# Patient Record
Sex: Male | Born: 2000 | Race: White | Hispanic: Yes | Marital: Single | Smoking: Never smoker
Health system: Southern US, Community
[De-identification: ages and names within clinical notes are randomized; demographics above are authoritative.]

## PROBLEM LIST (undated history)

## (undated) ENCOUNTER — Ambulatory Visit (HOSPITAL_COMMUNITY): Admission: EM | Disposition: A | Discharge status: Home / Self Care | Payer: Self-pay

## (undated) DIAGNOSIS — F909 Attention-deficit hyperactivity disorder, unspecified type: Secondary | ICD-10-CM

## (undated) DIAGNOSIS — F1994 Other psychoactive substance use, unspecified with psychoactive substance-induced mood disorder: Secondary | ICD-10-CM

## (undated) DIAGNOSIS — F1210 Cannabis abuse, uncomplicated: Secondary | ICD-10-CM

## (undated) DIAGNOSIS — F329 Major depressive disorder, single episode, unspecified: Secondary | ICD-10-CM

## (undated) DIAGNOSIS — J45909 Unspecified asthma, uncomplicated: Secondary | ICD-10-CM

## (undated) DIAGNOSIS — M359 Systemic involvement of connective tissue, unspecified: Secondary | ICD-10-CM

## (undated) DIAGNOSIS — Z8659 Personal history of other mental and behavioral disorders: Secondary | ICD-10-CM

## (undated) DIAGNOSIS — C801 Malignant (primary) neoplasm, unspecified: Secondary | ICD-10-CM

## (undated) HISTORY — PX: WISDOM TOOTH EXTRACTION: SHX21

---
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
## 2018-04-12 ENCOUNTER — Observation Stay
Admission: EM | Admit: 2018-04-12 | Discharge: 2018-04-13 | Disposition: A | Discharge status: Home / Self Care | Payer: Self-pay | Attending: Surgery | Admitting: Surgery

## 2018-04-12 ENCOUNTER — Other Ambulatory Visit: Payer: Self-pay

## 2018-04-12 ENCOUNTER — Encounter: Payer: Self-pay | Admitting: Emergency Medicine

## 2018-04-12 DIAGNOSIS — K611 Rectal abscess: Principal | ICD-10-CM | POA: Insufficient documentation

## 2018-04-12 LAB — CBC WITH DIFFERENTIAL/PLATELET
Abs Immature Granulocytes: 0.04 10*3/uL (ref 0.00–0.07)
BASOS ABS: 0 10*3/uL (ref 0.0–0.1)
Basophils Relative: 0 %
EOS ABS: 0.4 10*3/uL (ref 0.0–0.5)
EOS PCT: 3 %
HEMATOCRIT: 39.4 % (ref 39.0–52.0)
Hemoglobin: 13.8 g/dL (ref 13.0–17.0)
IMMATURE GRANULOCYTES: 0 %
LYMPHS ABS: 2.7 10*3/uL (ref 0.7–4.0)
Lymphocytes Relative: 24 %
MCH: 31.1 pg (ref 26.0–34.0)
MCHC: 35 g/dL (ref 30.0–36.0)
MCV: 88.7 fL (ref 80.0–100.0)
MONOS PCT: 8 %
Monocytes Absolute: 0.9 10*3/uL (ref 0.1–1.0)
NEUTROS PCT: 65 %
NRBC: 0 % (ref 0.0–0.2)
Neutro Abs: 7.3 10*3/uL (ref 1.7–7.7)
PLATELETS: 242 10*3/uL (ref 150–400)
RBC: 4.44 MIL/uL (ref 4.22–5.81)
RDW: 12.2 % (ref 11.5–15.5)
WBC: 11.3 10*3/uL — ABNORMAL HIGH (ref 4.0–10.5)

## 2018-04-12 MED ORDER — ONDANSETRON HCL 4 MG/2ML IJ SOLN
4.0000 mg | INTRAMUSCULAR | Status: AC
  Administered 2018-04-12: 4 mg via INTRAVENOUS
  Filled 2018-04-12: qty 2

## 2018-04-12 MED ORDER — MORPHINE SULFATE (PF) 4 MG/ML IV SOLN
4.0000 mg | Freq: Once | INTRAVENOUS | Status: AC
  Administered 2018-04-12: 4 mg via INTRAVENOUS
  Filled 2018-04-12: qty 1

## 2018-04-12 NOTE — ED Triage Notes (Signed)
Pt with non-draining raised area to the buttocks in between the anus and sacrum. Pt reports he has had it since Friday. Pt denies fever.

## 2018-04-13 ENCOUNTER — Encounter: Payer: Self-pay | Admitting: Surgery

## 2018-04-13 ENCOUNTER — Observation Stay: Payer: Self-pay | Admitting: Certified Registered Nurse Anesthetist

## 2018-04-13 ENCOUNTER — Encounter
Admission: EM | Disposition: A | Discharge status: Home / Self Care | Payer: Self-pay | Source: Home / Self Care | Attending: Emergency Medicine

## 2018-04-13 DIAGNOSIS — K611 Rectal abscess: Secondary | ICD-10-CM | POA: Diagnosis present

## 2018-04-13 HISTORY — PX: INCISION AND DRAINAGE ABSCESS: SHX5864

## 2018-04-13 LAB — URINE DRUG SCREEN, QUALITATIVE (ARMC ONLY)
Amphetamines, Ur Screen: NOT DETECTED
Barbiturates, Ur Screen: NOT DETECTED
Benzodiazepine, Ur Scrn: NOT DETECTED
Cannabinoid 50 Ng, Ur ~~LOC~~: POSITIVE — AB
Cocaine Metabolite,Ur ~~LOC~~: NOT DETECTED
MDMA (Ecstasy)Ur Screen: NOT DETECTED
Methadone Scn, Ur: NOT DETECTED
OPIATE, UR SCREEN: POSITIVE — AB
Phencyclidine (PCP) Ur S: NOT DETECTED
Tricyclic, Ur Screen: NOT DETECTED

## 2018-04-13 LAB — BASIC METABOLIC PANEL
Anion gap: 6 (ref 5–15)
BUN: 15 mg/dL (ref 6–20)
CALCIUM: 8.8 mg/dL — AB (ref 8.9–10.3)
CO2: 27 mmol/L (ref 22–32)
CREATININE: 0.91 mg/dL (ref 0.61–1.24)
Chloride: 107 mmol/L (ref 98–111)
GFR calc non Af Amer: >60 mL/min (ref >60)
Glucose, Bld: 94 mg/dL (ref 70–99)
Potassium: 3.7 mmol/L (ref 3.5–5.1)
Sodium: 140 mmol/L (ref 135–145)

## 2018-04-13 SURGERY — INCISION AND DRAINAGE, ABSCESS
Anesthesia: General

## 2018-04-13 MED ORDER — ROCURONIUM BROMIDE 100 MG/10ML IV SOLN
INTRAVENOUS | Status: DC | PRN
  Administered 2018-04-13: 25 mg via INTRAVENOUS

## 2018-04-13 MED ORDER — LIDOCAINE HCL (PF) 2 % IJ SOLN
INTRAMUSCULAR | Status: AC
  Filled 2018-04-13: qty 10

## 2018-04-13 MED ORDER — PROPOFOL 10 MG/ML IV BOLUS
INTRAVENOUS | Status: DC | PRN
  Administered 2018-04-13: 200 mg via INTRAVENOUS

## 2018-04-13 MED ORDER — PROMETHAZINE HCL 25 MG/ML IJ SOLN: 6.2500 mg | INTRAMUSCULAR | Status: DC | PRN

## 2018-04-13 MED ORDER — ROCURONIUM BROMIDE 50 MG/5ML IV SOLN
INTRAVENOUS | Status: AC
  Filled 2018-04-13: qty 1

## 2018-04-13 MED ORDER — MIDAZOLAM HCL 2 MG/2ML IJ SOLN
INTRAMUSCULAR | Status: AC
  Filled 2018-04-13: qty 2

## 2018-04-13 MED ORDER — BUPIVACAINE-EPINEPHRINE (PF) 0.5% -1:200000 IJ SOLN
INTRAMUSCULAR | Status: AC
  Filled 2018-04-13: qty 30

## 2018-04-13 MED ORDER — LIDOCAINE HCL URETHRAL/MUCOSAL 2 % EX GEL
CUTANEOUS | Status: AC
  Filled 2018-04-13: qty 5

## 2018-04-13 MED ORDER — ACETAMINOPHEN 325 MG PO TABS: 650.0000 mg | ORAL_TABLET | Freq: Three times a day (TID) | ORAL | 0 refills | Status: AC | PRN

## 2018-04-13 MED ORDER — SUGAMMADEX SODIUM 200 MG/2ML IV SOLN
INTRAVENOUS | Status: AC
  Filled 2018-04-13: qty 2

## 2018-04-13 MED ORDER — SUCCINYLCHOLINE CHLORIDE 20 MG/ML IJ SOLN
INTRAMUSCULAR | Status: AC
  Filled 2018-04-13: qty 1

## 2018-04-13 MED ORDER — MIDAZOLAM HCL 2 MG/2ML IJ SOLN
INTRAMUSCULAR | Status: DC | PRN
  Administered 2018-04-13: 2 mg via INTRAVENOUS

## 2018-04-13 MED ORDER — BUPIVACAINE-EPINEPHRINE 0.5% -1:200000 IJ SOLN
INTRAMUSCULAR | Status: DC | PRN
  Administered 2018-04-13: 20 mL

## 2018-04-13 MED ORDER — OXYCODONE HCL 5 MG PO TABS
ORAL_TABLET | ORAL | Status: AC
  Filled 2018-04-13: qty 1

## 2018-04-13 MED ORDER — DEXAMETHASONE SODIUM PHOSPHATE 10 MG/ML IJ SOLN
INTRAMUSCULAR | Status: DC | PRN
  Administered 2018-04-13: 10 mg via INTRAVENOUS

## 2018-04-13 MED ORDER — SUGAMMADEX SODIUM 200 MG/2ML IV SOLN
INTRAVENOUS | Status: DC | PRN
  Administered 2018-04-13: 200 mg via INTRAVENOUS

## 2018-04-13 MED ORDER — OXYCODONE HCL 5 MG/5ML PO SOLN: 5.0000 mg | Freq: Once | ORAL | Status: AC | PRN

## 2018-04-13 MED ORDER — MEPERIDINE HCL 50 MG/ML IJ SOLN: 6.2500 mg | INTRAMUSCULAR | Status: DC | PRN

## 2018-04-13 MED ORDER — LACTATED RINGERS IV SOLN
INTRAVENOUS | Status: DC
  Administered 2018-04-13 (×2): via INTRAVENOUS

## 2018-04-13 MED ORDER — GLYCOPYRROLATE 0.2 MG/ML IJ SOLN
INTRAMUSCULAR | Status: AC
  Filled 2018-04-13: qty 1

## 2018-04-13 MED ORDER — OXYCODONE HCL 5 MG PO TABS
5.0000 mg | ORAL_TABLET | Freq: Once | ORAL | Status: AC | PRN
  Administered 2018-04-13: 5 mg via ORAL

## 2018-04-13 MED ORDER — MORPHINE SULFATE (PF) 2 MG/ML IV SOLN
1.0000 mg | INTRAVENOUS | Status: DC | PRN
  Administered 2018-04-13: 1 mg via INTRAVENOUS
  Filled 2018-04-13: qty 1

## 2018-04-13 MED ORDER — DEXAMETHASONE SODIUM PHOSPHATE 10 MG/ML IJ SOLN
INTRAMUSCULAR | Status: AC
  Filled 2018-04-13: qty 1

## 2018-04-13 MED ORDER — ONDANSETRON HCL 4 MG/2ML IJ SOLN
INTRAMUSCULAR | Status: DC | PRN
  Administered 2018-04-13: 4 mg via INTRAVENOUS

## 2018-04-13 MED ORDER — SUCCINYLCHOLINE CHLORIDE 20 MG/ML IJ SOLN
INTRAMUSCULAR | Status: DC | PRN
  Administered 2018-04-13: 100 mg via INTRAVENOUS

## 2018-04-13 MED ORDER — SODIUM CHLORIDE 0.9 % IV SOLN
3.0000 g | Freq: Four times a day (QID) | INTRAVENOUS | Status: DC
  Administered 2018-04-13: 3 g via INTRAVENOUS
  Filled 2018-04-13: qty 3

## 2018-04-13 MED ORDER — ONDANSETRON HCL 4 MG/2ML IJ SOLN
INTRAMUSCULAR | Status: AC
  Filled 2018-04-13: qty 2

## 2018-04-13 MED ORDER — DOCUSATE SODIUM 100 MG PO CAPS: 100.0000 mg | ORAL_CAPSULE | Freq: Two times a day (BID) | ORAL | 0 refills | Status: AC | PRN

## 2018-04-13 MED ORDER — FENTANYL CITRATE (PF) 100 MCG/2ML IJ SOLN
INTRAMUSCULAR | Status: AC
  Filled 2018-04-13: qty 2

## 2018-04-13 MED ORDER — IBUPROFEN 800 MG PO TABS: 800.0000 mg | ORAL_TABLET | Freq: Three times a day (TID) | ORAL | 0 refills | Status: DC | PRN

## 2018-04-13 MED ORDER — HYDROCODONE-ACETAMINOPHEN 5-325 MG PO TABS: 1.0000 | ORAL_TABLET | Freq: Four times a day (QID) | ORAL | 0 refills | Status: AC | PRN

## 2018-04-13 MED ORDER — PHENYLEPHRINE HCL 10 MG/ML IJ SOLN
INTRAMUSCULAR | Status: AC
  Filled 2018-04-13: qty 1

## 2018-04-13 MED ORDER — ONDANSETRON 4 MG PO TBDP: 4.0000 mg | ORAL_TABLET | Freq: Four times a day (QID) | ORAL | Status: DC | PRN

## 2018-04-13 MED ORDER — PROPOFOL 10 MG/ML IV BOLUS
INTRAVENOUS | Status: AC
  Filled 2018-04-13: qty 40

## 2018-04-13 MED ORDER — LIDOCAINE HCL (CARDIAC) PF 100 MG/5ML IV SOSY
PREFILLED_SYRINGE | INTRAVENOUS | Status: DC | PRN
  Administered 2018-04-13: 100 mg via INTRAVENOUS

## 2018-04-13 MED ORDER — DOCUSATE SODIUM 100 MG PO CAPS: 100.0000 mg | ORAL_CAPSULE | Freq: Two times a day (BID) | ORAL | Status: DC | PRN

## 2018-04-13 MED ORDER — FENTANYL CITRATE (PF) 100 MCG/2ML IJ SOLN: 25.0000 ug | INTRAMUSCULAR | Status: DC | PRN

## 2018-04-13 MED ORDER — FENTANYL CITRATE (PF) 100 MCG/2ML IJ SOLN
INTRAMUSCULAR | Status: DC | PRN
  Administered 2018-04-13 (×2): 50 ug via INTRAVENOUS

## 2018-04-13 MED ORDER — ONDANSETRON HCL 4 MG/2ML IJ SOLN: 4.0000 mg | Freq: Four times a day (QID) | INTRAMUSCULAR | Status: DC | PRN

## 2018-04-13 SURGICAL SUPPLY — 26 items
BLADE SURG 15 STRL LF DISP TIS (BLADE) ×1 IMPLANT
BLADE SURG 15 STRL SS (BLADE) ×1
CANISTER SUCT 1200ML W/VALVE (MISCELLANEOUS) ×2 IMPLANT
CHLORAPREP W/TINT 26ML (MISCELLANEOUS) ×2 IMPLANT
COVER WAND RF STERILE (DRAPES) ×2 IMPLANT
DRAPE LAPAROTOMY 100X77 ABD (DRAPES) ×2 IMPLANT
ELECT REM PT RETURN 9FT ADLT (ELECTROSURGICAL) ×2
ELECTRODE REM PT RTRN 9FT ADLT (ELECTROSURGICAL) ×1 IMPLANT
GAUZE PACKING IODOFORM 1/2 (PACKING) ×2 IMPLANT
GLOVE BIOGEL PI IND STRL 7.0 (GLOVE) ×1 IMPLANT
GLOVE BIOGEL PI INDICATOR 7.0 (GLOVE) ×1
GLOVE SURG SYN 7.0 (GLOVE) ×4 IMPLANT
GOWN STRL REUS W/ TWL LRG LVL3 (GOWN DISPOSABLE) ×2 IMPLANT
GOWN STRL REUS W/TWL LRG LVL3 (GOWN DISPOSABLE) ×2
LABEL OR SOLS (LABEL) ×2 IMPLANT
NEEDLE HYPO 22GX1.5 SAFETY (NEEDLE) ×2 IMPLANT
NS IRRIG 500ML POUR BTL (IV SOLUTION) ×2 IMPLANT
PACK BASIN MINOR ARMC (MISCELLANEOUS) ×2 IMPLANT
SUT MNCRL 4-0 (SUTURE)
SUT MNCRL 4-0 27XMFL (SUTURE)
SUT VIC AB 2-0 CT2 27 (SUTURE) IMPLANT
SUT VIC AB 3-0 SH 27 (SUTURE)
SUT VIC AB 3-0 SH 27X BRD (SUTURE) IMPLANT
SUTURE MNCRL 4-0 27XMF (SUTURE) IMPLANT
SYR 10ML LL (SYRINGE) ×2 IMPLANT
TOWEL OR 17X26 4PK STRL BLUE (TOWEL DISPOSABLE) ×2 IMPLANT

## 2018-04-13 NOTE — ED Notes (Signed)
Pt resting on stretcher with lights out to enhance rest. Eyes closed and even respirations. No acute distress noted. Family at the bedside.

## 2018-04-13 NOTE — Anesthesia Postprocedure Evaluation (Signed)
Anesthesia Post Note  Patient: Emergency planning/management officer  Procedure(s) Performed: INCISION AND DRAINAGE ABSCESS (N/A )  Patient location during evaluation: PACU Anesthesia Type: General Level of consciousness: awake and alert and oriented Pain management: pain level controlled Vital Signs Assessment: post-procedure vital signs reviewed and stable Respiratory status: spontaneous breathing, nonlabored ventilation and respiratory function stable Cardiovascular status: blood pressure returned to baseline and stable Postop Assessment: no signs of nausea or vomiting Anesthetic complications: no     Last Vitals:  Vitals:   04/13/18 1007 04/13/18 1049  BP: 118/73 113/74  Pulse: 63 66  Resp: 14 16  Temp: (!) 36.3 C   SpO2: 100% 99%    Last Pain:  Vitals:   04/13/18 1049  TempSrc:   PainSc: 2                  Cairo Lingenfelter

## 2018-04-13 NOTE — ED Notes (Signed)
Pt being transported to OR at this time via stretcher.

## 2018-04-13 NOTE — Op Note (Signed)
Preoperative diagnosis: perirectal abscess Postoperative diagnosis: same  Procedure: Incision and drainage of perirectal abscess  Anesthesia: GETA  Surgeon: Lysle Pearl  Wound Classification: Contaminated  Indications: Patient is a 18 y.o. male  presented with perirectal abscess.  See H&P for further details.  Specimen: wound cultures  Complications: None  Estimated Blood Loss: 42mL  Findings:  1. Isolated perirectal abscess.  No indication of fistula or other pathology 2. purulent secretions drained and cultured 3. Adequate hemostasis.   Description of procedure: The patient was placed in the prone position after GETA anesthesia was induced on the stretcher. The area was prepped and draped in the usual sterile fashion. A timeout was completed verifying correct patient, procedure, site, positioning, and implant(s) and/or special equipment prior to beginning this procedure.   Inicision made across the most indurated area of the abscess and purulent secretions noted in subcutaneous tissue and drained. Cultures taken.  Incision then extended in a cruciate fashion to ensure entire cavity base was explored.  With a hemostat blunt dissection of septas performed to drain the abscess completely.  Probing of the wound base did not note any fistulas, or additional tunneling.   The 3cm skin incision over the 7cm x 6cm x 5cm deep cavity then irrigated, hemostasis achieved with electrocautery and pressure.  Wound then packed with an iodine packing, dressed with fluffs and secured with mesh undergarment.  The patient tolerated the procedure well and was taken to the postanesthesia care unit in satisfactory condition. All sponge and instrument counts correct at end of procedure.

## 2018-04-13 NOTE — H&P (Signed)
Subjective:   CC: perianal abscess  HPI:  Derrick Mayer is a 18 y.o. male who was consulted by [place] for evaluation of above. First noted 5 days ago.  Symptoms include: Pain is sharp, increasing.  Exacerbated by palpation or movement.  Alleviated by nothing specific.  Associated with increasing lump in the area.     Past Medical History:  has a past medical history of ADHD (attention deficit hyperactivity disorder), Asthma, Attention deficit hyperactivity disorder (ADHD) (05/24/2015), Cancer ([place]), Cannabis abuse (05/20/2015), Collagen vascular disease ([place]), Depressive disorder (05/20/2015), History of ADHD (05/20/2015), and Substance induced mood disorder ([place]) (05/20/2015).  Past Surgical History:  has a past surgical history that includes Wisdom tooth extraction.  Family History: reviwed and not relevant to CC  Social History:  reports that he has never smoked. He has never used smokeless tobacco. He reports that he does not drink alcohol, but admits to marijuana use  Current Medications: none reported  Allergies:  No Known Allergies  ROS:  General: Denies weight loss, weight gain, fatigue, fevers, chills, and night sweats. Eyes: Denies blurry vision, double vision, eye pain, itchy eyes, and tearing. Ears: Denies hearing loss, earache, and ringing in ears. Nose: Denies sinus pain, congestion, infections, runny nose, and nosebleeds. Mouth/throat: Denies hoarseness, sore throat, bleeding gums, and difficulty swallowing. Heart: Denies chest pain, palpitations, racing heart, irregular heartbeat, leg pain or swelling, and decreased activity tolerance. Respiratory: Denies breathing difficulty, shortness of breath, wheezing, cough, and sputum. GI: Denies change in appetite, heartburn, nausea, vomiting, constipation, diarrhea, and blood in stool. GU: Denies difficulty urinating, pain with urinating, urgency, frequency, blood in urine. Musculoskeletal: Denies joint stiffness, pain, swelling,  muscle weakness. Skin: Denies rash, itching, mass, tumors, sores, and boils Neurologic: Denies headache, fainting, dizziness, seizures, numbness, and tingling. Psychiatric: Denies depression, anxiety, difficulty sleeping, and memory loss. Endocrine: Denies heat or cold intolerance, and increased thirst or urination. Blood/lymph: Denies easy bruising, easy bruising, and swollen glands    Objective:     BP 121/68   Pulse 66   Temp 98.1 F (36.7 C) (Oral)   Resp 15   Wt 81.6 kg   SpO2 99%   Constitutional :  alert, cooperative, appears stated age and no distress  Lymphatics/Throat:  no asymmetry, masses, or scars  Respiratory:  clear to auscultation bilaterally  Cardiovascular:  regular rate and rhythm  Gastrointestinal: soft, non-tender; bowel sounds normal; no masses,  no organomegaly.  Musculoskeletal: Steady gait and movement  Skin: Cool and moist, large, erythematous, indurated area consistent with abscess in right perirectal region, measuring approx 8 cm x 6cm   Psychiatric: Normal affect, non-agitated, not confused       LABS:  CMP Latest Ref Rng & Units 04/01/2016 06/22/2015 06/15/2015  Glucose 65 - 99 mg/dL 126(H) 102(H) 106(H)  BUN 6 - 20 mg/dL 13 14 10   Creatinine 0.50 - 1.00 mg/dL 1.20(H) 0.89 1.02(H)  Sodium 135 - 145 mmol/L 136 137 140  Potassium 3.5 - 5.1 mmol/L 3.5 3.5 3.6  Chloride 101 - 111 mmol/L 102 106 107  CO2 22 - 32 mmol/L 24 22 22   Calcium 8.9 - 10.3 mg/dL 9.1 8.5(L) 9.3  Total Protein 6.5 - 8.1 g/dL 7.9 7.3 7.3  Total Bilirubin 0.3 - 1.2 mg/dL 0.6 0.6 0.7  Alkaline Phos 52 - 171 U/L 76 116 110  AST 15 - 41 U/L 25 20 23   ALT 17 - 63 U/L 21 16(L) 18   CBC Latest Ref Rng & Units 04/12/2018 04/01/2016  06/22/2015  WBC 4.0 - 10.5 K/uL 11.3(H) 6.6 8.0  Hemoglobin 13.0 - 17.0 g/dL 13.8 16.1(H) 14.5  Hematocrit 39.0 - 52.0 % 39.4 43.8 41.3  Platelets 150 - 400 K/uL 242 158 186    RADS: n/a  Assessment:      Perirectal abscess  Plan:     1.  Alternatives include continued observation with abx.  Benefits include possible symptom relief, . Discussed the risk of surgery including recurrence, poor/delayed wound healing, and possible re-operation to address said risks. The risks of general anesthetic, if used, includes MI, CVA, sudden death or even reaction to anesthetic medications also discussed.  Typical post-op recovery time of 3-5 days with possible activity restrictions were also discussed.  The patient verbalized understanding and all questions were answered to the patient's satisfaction.  Admit to floor while waiting for OR availability since patient is refusing bedside I&D.  Scheduled for 0700.

## 2018-04-13 NOTE — ED Notes (Signed)
Pt resting on stretcher with lights out to enhance rest. Family at the bedside. No distress noted.

## 2018-04-13 NOTE — Transfer of Care (Signed)
Immediate Anesthesia Transfer of Care Note  Patient: Saeed Brumett  Procedure(s) Performed: INCISION AND DRAINAGE ABSCESS (N/A )  Patient Location: PACU  Anesthesia Type:General  Level of Consciousness: drowsy  Airway & Oxygen Therapy: Patient Spontanous Breathing and Patient connected to face mask oxygen  Post-op Assessment: Report given to RN and Post -op Vital signs reviewed and stable  Post vital signs: Reviewed and stable  Last Vitals:  Vitals Value Taken Time  BP 101/76 04/13/2018 9 ;02  Temp    Pulse 71 04/13/2018  9:19 AM  Resp 15 04/13/2018  9:19 AM  SpO2 100 % 04/13/2018  9:19 AM  Vitals shown include unvalidated device data.  Last Pain:  Vitals:   04/13/18 0902  TempSrc:   PainSc: Asleep         Complications: No apparent anesthesia complications

## 2018-04-13 NOTE — ED Notes (Addendum)
Surgery set for 0700 on 04/13/2018 at this time.

## 2018-04-13 NOTE — Anesthesia Procedure Notes (Signed)
Procedure Name: Intubation Date/Time: 04/13/2018 8:15 AM Performed by: Rudean Hitt, CRNA Pre-anesthesia Checklist: Patient identified, Patient being monitored, Timeout performed, Emergency Drugs available and Suction available Patient Re-evaluated:Patient Re-evaluated prior to induction Oxygen Delivery Method: Circle system utilized Preoxygenation: Pre-oxygenation with 100% oxygen Induction Type: IV induction Ventilation: Mask ventilation without difficulty Laryngoscope Size: Mac and 3 Grade View: Grade I Tube type: Oral Tube size: 7.5 mm Number of attempts: 1 Airway Equipment and Method: Stylet Placement Confirmation: ETT inserted through vocal cords under direct vision,  positive ETCO2 and breath sounds checked- equal and bilateral Secured at: 22 cm Tube secured with: Tape Dental Injury: Teeth and Oropharynx as per pre-operative assessment

## 2018-04-13 NOTE — Anesthesia Preprocedure Evaluation (Signed)
Anesthesia Evaluation  Patient identified by MRN, date of birth, ID band Patient awake    Reviewed: Allergy & Precautions, NPO status , Patient's Chart, lab work & pertinent test results  History of Anesthesia Complications Negative for: history of anesthetic complications  Airway Mallampati: II  TM Distance: >3 FB Neck ROM: Full    Dental no notable dental hx.    Pulmonary neg pulmonary ROS, neg sleep apnea, neg COPD,    breath sounds clear to auscultation- rhonchi (-) wheezing      Cardiovascular Exercise Tolerance: Good (-) hypertension(-) CAD and (-) Past MI  Rhythm:Regular Rate:Normal - Systolic murmurs and - Diastolic murmurs    Neuro/Psych PSYCHIATRIC DISORDERS Depression negative neurological ROS     GI/Hepatic negative GI ROS, Neg liver ROS,   Endo/Other  negative endocrine ROSneg diabetes  Renal/GU negative Renal ROS     Musculoskeletal negative musculoskeletal ROS (+)   Abdominal (+) - obese,   Peds  Hematology negative hematology ROS (+)   Anesthesia Other Findings Past Medical History: No date: ADHD (attention deficit hyperactivity disorder) No date: Asthma 05/24/2015: Attention deficit hyperactivity disorder (ADHD) No date: Cancer ([place]) 05/20/2015: Cannabis abuse No date: Collagen vascular disease ([place]) 05/20/2015: Depressive disorder 05/20/2015: History of ADHD 05/20/2015: Substance induced mood disorder ([facility])   Reproductive/Obstetrics                             Anesthesia Physical Anesthesia Plan  ASA: II  Anesthesia Plan: General   Post-op Pain Management:    Induction: Intravenous  PONV Risk Score and Plan: 1 and Ondansetron, Dexamethasone and Midazolam  Airway Management Planned: Oral ETT  Additional Equipment:   Intra-op Plan:   Post-operative Plan: Extubation in OR  Informed Consent: I have reviewed the patients History and Physical, chart, labs and  discussed the procedure including the risks, benefits and alternatives for the proposed anesthesia with the patient or authorized representative who has indicated his/her understanding and acceptance.     Dental advisory given  Plan Discussed with: CRNA and Anesthesiologist  Anesthesia Plan Comments:         Anesthesia Quick Evaluation

## 2018-04-13 NOTE — ED Notes (Signed)
Pt asked if he had spoken with the surgeon about his surgery. Pt states he was told the time but not about the procedure. Pt wanting to wait until he speaks with surgeon prior to signing his consent form. Will send to OR with pt.

## 2018-04-13 NOTE — ED Provider Notes (Signed)
[facility] Emergency Department Provider Note  ____________________________________________   First MD Initiated Contact with Patient 04/12/18 2300     (approximate)  I have reviewed the triage vital signs and the nursing notes.   HISTORY  Chief Complaint Abscess    HPI Derrick Mayer is a 18 y.o. male with medical history as listed below who presents for evaluation of a red and severely painful area on his left buttock.  It started developing about 4 to 5 days ago and is steadily gotten worse.  It is much worse with any pressure, sitting, and now walking.  Nothing in particular makes it better.  He cannot see exactly where it is but it seems to be located on his left buttock and tracks towards his anus.  He is having normal bowel movements but wiping is very painful.  He denies fever/chills, chest pain, shortness of breath, nausea, vomiting, and abdominal pain.  He has no pain in his penis or scrotum.  He has a history of a prior abscess that was up higher on his buttock but says that this is much much worse.  The pain is severe, sharp, and aching.  Past Medical History:  Diagnosis Date  . ADHD (attention deficit hyperactivity disorder)   . Asthma   . Attention deficit hyperactivity disorder (ADHD) 05/24/2015  . Cancer ([place])   . Cannabis abuse 05/20/2015  . Collagen vascular disease ([place])   . Depressive disorder 05/20/2015  . History of ADHD 05/20/2015  . Substance induced mood disorder ([place]) 05/20/2015    Patient Active Problem List   Diagnosis Date Noted  . Perirectal abscess 04/13/2018  . Attention deficit hyperactivity disorder (ADHD) 05/24/2015  . Cannabis abuse 05/20/2015  . Depressive disorder 05/20/2015  . Substance induced mood disorder ([place]) 05/20/2015  . ADHD (attention deficit hyperactivity disorder) 10/30/2012  . Knee contusion 09/29/2012  . Left knee sprain 09/13/2012  . Abrasion of left leg 06/20/2012    Past Surgical History:    Procedure Laterality Date  . WISDOM TOOTH EXTRACTION      Prior to Admission medications   Medication Sig Start Date End Date Taking? Authorizing Provider  doxycycline (VIBRA-TABS) 100 MG tablet Take 1 tablet (100 mg total) by mouth 2 (two) times daily. 07/06/17   Robyn Haber, MD  ibuprofen (ADVIL,MOTRIN) 800 MG tablet Take 1 tablet (800 mg total) by mouth 3 (three) times daily. 07/08/17   Wieters, Hallie C, PA-C    Allergies Patient has no known allergies.  History reviewed. No pertinent family history.  Social History Social History   Tobacco Use  . Smoking status: Never Smoker  . Smokeless tobacco: Never Used  Substance Use Topics  . Alcohol use: No  . Drug use: No    Types: Marijuana    Review of Systems Constitutional: No fever/chills Eyes: No visual changes. ENT: No sore throat. Cardiovascular: Denies chest pain. Respiratory: Denies shortness of breath. Gastrointestinal: No abdominal pain.  No nausea, no vomiting.  No diarrhea.  No constipation. Genitourinary: Severe pain, redness, and swelling of his left buttock as described above. Musculoskeletal: Negative for neck pain.  Negative for back pain. Integumentary: Negative for rash. Neurological: Negative for headaches, focal weakness or numbness.   ____________________________________________   PHYSICAL EXAM:  VITAL SIGNS: ED Triage Vitals  Enc Vitals Group     BP 04/12/18 2052 115/74     Pulse Rate 04/12/18 2052 66     Resp 04/12/18 2052 17     Temp 04/12/18  2052 98.1 F (36.7 C)     Temp Source 04/12/18 2052 Oral     SpO2 04/12/18 2052 98 %     Weight 04/12/18 2052 81.6 kg (180 lb)     Height --      Head Circumference --      Peak Flow --      Pain Score 04/12/18 2310 10     Pain Loc --      Pain Edu? --      Excl. in [place]? --     Constitutional: Alert and oriented.  Appears very uncomfortable. Eyes: Conjunctivae are normal.  Head: Atraumatic. Nose: No  congestion/rhinnorhea. Mouth/Throat: Mucous membranes are moist. Neck: No stridor.  No meningeal signs.   Cardiovascular: Normal rate, regular rhythm. Good peripheral circulation. Grossly normal heart sounds. Respiratory: Normal respiratory effort.  No retractions. Lungs CTAB. Gastrointestinal: Soft and nontender. No distention.  GU/Rectal: Large area of erythema and induration measuring at least 8 cm x 6 cm on his left buttock tracking directly to the anus.  Exam limited by the patient's pain.  No fluctuance but again the exam is limited.  No drainage.  No crepitus of the perineum, no purulence or discharge, no scrotal involvement. Musculoskeletal: No lower extremity tenderness nor edema. No gross deformities of extremities. Neurologic:  Normal speech and language. No gross focal neurologic deficits are appreciated.  Skin:  Skin is warm, dry and intact. No rash noted. Psychiatric: Mood and affect are normal. Speech and behavior are normal.  ____________________________________________   LABS (all labs ordered are listed, but only abnormal results are displayed)  Labs Reviewed  CBC WITH DIFFERENTIAL/PLATELET - Abnormal; Notable for the following components:      Result Value   WBC 11.3 (*)    All other components within normal limits  BASIC METABOLIC PANEL - Abnormal; Notable for the following components:   Calcium 8.8 (*)    All other components within normal limits   ____________________________________________  EKG  No indication for EKG ____________________________________________  RADIOLOGY   ED MD interpretation: No indication for imaging  Official radiology report(s): No results found.  ____________________________________________   PROCEDURES  Critical Care performed: No   Procedure(s) performed:   Procedures   ____________________________________________   INITIAL IMPRESSION / ASSESSMENT AND PLAN / ED COURSE  As part of my medical decision making, I  reviewed the following data within the [place] notes reviewed and incorporated, Labs reviewed , Discussed with admitting physician , A consult was requested and obtained from this/these consultant(s) Surgery, Notes from prior ED visits and [place] Controlled Substance Database    Differential diagnosis includes, but is not limited to, cutaneous abscess of the buttock, perirectal abscess, perianal abscess, Fournier's gangrene.  The size of the abscess is very large and not amenable to bedside drainage.  The patient is also in severe pain and is begging to be put to sleep before anything is drained.  He is awfully very uncomfortable and is being given morphine 4 mg IV and Zofran 4 mg IV.  I have asked him to not eat or drink anything and the last time he had anything to eat or drink was 6 PM.  I will consult surgery to ask him to look at the patient but I may also need to obtain a CT scan of the pelvis with IV contrast for further evaluation.  I think he would be best served by incision and drainage in the operating room.  The patient  agrees with the plan.  Clinical Course as of Apr 14 27  Tue Apr 12, 2018  2342 I spoke by phone with Dr. Lysle Pearl with general surgery about the location and size of the patient's abscess.  He is coming in to evaluate the patient in person and asked that I not proceed yet with a CT scan.   [CF]  Wed Apr 13, 2018  0029 I spoke with Dr. Lysle Pearl in person after he evaluated the patient.  He will admit the patient to the hospital and take him to the OR in the morning.   [CF]    Clinical Course User Index [CF] Hinda Kehr, MD    ____________________________________________  FINAL CLINICAL IMPRESSION(S) / ED DIAGNOSES  Final diagnoses:  Perirectal abscess     MEDICATIONS GIVEN DURING THIS VISIT:  Medications  morphine 4 MG/ML injection 4 mg (4 mg Intravenous Given 04/12/18 2347)  ondansetron (ZOFRAN) injection 4 mg (4 mg Intravenous Given  04/12/18 2347)     ED Discharge Orders    None       Note:  This document was prepared using Dragon voice recognition software and may include unintentional dictation errors.   Hinda Kehr, MD 04/13/18 [id number]

## 2018-04-13 NOTE — Discharge Instructions (Signed)

## 2018-04-13 NOTE — ED Notes (Signed)
Pt moved to room 13 due to needing negative pressure room. Tolerated well. Pt is alert with family at the bedside. Reminded of proposed plan for surgery. Verbalized understanding.

## 2018-04-13 NOTE — Anesthesia Post-op Follow-up Note (Signed)
Anesthesia QCDR form completed.        

## 2018-04-15 NOTE — Discharge Summary (Signed)
Physician Discharge Summary  Patient ID: Derrick Mayer MRN: [id number] DOB/AGE: [birth year] 18 y.o.  Admit date: 04/12/2018 Discharge date: 04/15/2018  Admission Diagnoses: perirectal abscess  Discharge Diagnoses:  Same as above  Discharged Condition: good  Hospital Course: diagnosed with perirectal abscess, went to OR for I&D, sent home with close f/u  Consults: None  Discharge Exam: Blood pressure 113/74, pulse 66, temperature (!) 97.3 F (36.3 C), temperature source Temporal, resp. rate 16, weight 81.6 kg, SpO2 99 %. General appearance: alert, cooperative and no distress  Disposition:  Discharge disposition: 01-Home or Self Care       Discharge Instructions    Discharge patient   Complete by:  As directed    Discharge disposition:  01-Home or Self Care   Discharge patient date:  04/13/2018     Allergies as of 04/13/2018   No Known Allergies     Medication List    STOP taking these medications   doxycycline 100 MG tablet Commonly known as:  VIBRA-TABS     TAKE these medications   acetaminophen 325 MG tablet Commonly known as:  TYLENOL Take 2 tablets (650 mg total) by mouth every 8 (eight) hours as needed for up to 30 days for mild pain. What changed:    medication strength  how much to take  when to take this  reasons to take this   docusate sodium 100 MG capsule Commonly known as:  COLACE Take 1 capsule (100 mg total) by mouth 2 (two) times daily as needed for up to 10 days for mild constipation.   HYDROcodone-acetaminophen 5-325 MG tablet Commonly known as:  NORCO Take 1 tablet by mouth every 6 (six) hours as needed for up to 3 days for moderate pain.   ibuprofen 800 MG tablet Commonly known as:  ADVIL,MOTRIN Take 1 tablet (800 mg total) by mouth every 8 (eight) hours as needed for mild pain or moderate pain. What changed:    when to take this  reasons to take this      Follow-up Information    Follow up On 04/15/2018.   Why:  For wound  re-check and packing removal       Derrick Mayer, Derrick Osmer, DO Follow up on 04/15/2018.   Specialty:  Surgery Why:  @ 9:15 am for wound re-check and packing removal Contact information: [place] [address] [phone]            Signed: Benjamine Mayer 04/15/2018, 7:54 AM

## 2018-04-18 LAB — AEROBIC/ANAEROBIC CULTURE W GRAM STAIN (SURGICAL/DEEP WOUND)

## 2019-03-28 ENCOUNTER — Ambulatory Visit: Payer: Self-pay | Attending: Internal Medicine

## 2019-03-28 ENCOUNTER — Other Ambulatory Visit: Payer: Self-pay

## 2019-03-28 DIAGNOSIS — Z20822 Contact with and (suspected) exposure to covid-19: Secondary | ICD-10-CM | POA: Insufficient documentation

## 2019-03-29 LAB — NOVEL CORONAVIRUS, NAA: SARS-CoV-2, NAA: NOT DETECTED

## 2019-04-05 ENCOUNTER — Encounter: Payer: Self-pay | Admitting: Family Medicine

## 2019-04-06 ENCOUNTER — Encounter: Payer: Self-pay | Admitting: Family Medicine

## 2019-11-22 ENCOUNTER — Other Ambulatory Visit: Payer: Self-pay

## 2020-02-20 ENCOUNTER — Encounter (HOSPITAL_COMMUNITY): Payer: Self-pay | Admitting: Emergency Medicine

## 2020-02-20 ENCOUNTER — Encounter: Payer: Self-pay | Admitting: Emergency Medicine

## 2020-02-20 ENCOUNTER — Emergency Department: Payer: BC Managed Care – PPO

## 2020-02-20 ENCOUNTER — Other Ambulatory Visit: Payer: Self-pay

## 2020-02-20 ENCOUNTER — Emergency Department (HOSPITAL_COMMUNITY)
Admission: EM | Admit: 2020-02-20 | Discharge: 2020-02-20 | Disposition: A | Discharge status: Home / Self Care | Payer: BC Managed Care – PPO | Attending: Emergency Medicine | Admitting: Emergency Medicine

## 2020-02-20 DIAGNOSIS — R509 Fever, unspecified: Secondary | ICD-10-CM | POA: Insufficient documentation

## 2020-02-20 DIAGNOSIS — Z5321 Procedure and treatment not carried out due to patient leaving prior to being seen by health care provider: Secondary | ICD-10-CM | POA: Diagnosis not present

## 2020-02-20 DIAGNOSIS — Z23 Encounter for immunization: Secondary | ICD-10-CM | POA: Diagnosis not present

## 2020-02-20 DIAGNOSIS — L089 Local infection of the skin and subcutaneous tissue, unspecified: Secondary | ICD-10-CM | POA: Insufficient documentation

## 2020-02-20 DIAGNOSIS — Z20822 Contact with and (suspected) exposure to covid-19: Secondary | ICD-10-CM | POA: Insufficient documentation

## 2020-02-20 DIAGNOSIS — L03011 Cellulitis of right finger: Secondary | ICD-10-CM | POA: Diagnosis not present

## 2020-02-20 DIAGNOSIS — J45909 Unspecified asthma, uncomplicated: Secondary | ICD-10-CM | POA: Insufficient documentation

## 2020-02-20 DIAGNOSIS — F909 Attention-deficit hyperactivity disorder, unspecified type: Secondary | ICD-10-CM | POA: Insufficient documentation

## 2020-02-20 DIAGNOSIS — M79644 Pain in right finger(s): Secondary | ICD-10-CM | POA: Diagnosis present

## 2020-02-20 LAB — COMPREHENSIVE METABOLIC PANEL
ALT: 61 U/L — ABNORMAL HIGH (ref 0–44)
AST: 21 U/L (ref 15–41)
Albumin: 3.9 g/dL (ref 3.5–5.0)
Alkaline Phosphatase: 99 U/L (ref 38–126)
Anion gap: 9 (ref 5–15)
BUN: 10 mg/dL (ref 6–20)
CO2: 23 mmol/L (ref 22–32)
Calcium: 9 mg/dL (ref 8.9–10.3)
Chloride: 103 mmol/L (ref 98–111)
Creatinine, Ser: 1.01 mg/dL (ref 0.61–1.24)
GFR, Estimated: >60 mL/min (ref >60)
Glucose, Bld: 121 mg/dL — ABNORMAL HIGH (ref 70–99)
Potassium: 3.4 mmol/L — ABNORMAL LOW (ref 3.5–5.1)
Sodium: 135 mmol/L (ref 135–145)
Total Bilirubin: 0.8 mg/dL (ref 0.3–1.2)
Total Protein: 7.5 g/dL (ref 6.5–8.1)

## 2020-02-20 LAB — CBC WITH DIFFERENTIAL/PLATELET
Abs Immature Granulocytes: 0.05 10*3/uL (ref 0.00–0.07)
Basophils Absolute: 0 10*3/uL (ref 0.0–0.1)
Basophils Relative: 0 %
Eosinophils Absolute: 0.1 10*3/uL (ref 0.0–0.5)
Eosinophils Relative: 1 %
HCT: 42 % (ref 39.0–52.0)
Hemoglobin: 14.4 g/dL (ref 13.0–17.0)
Immature Granulocytes: 1 %
Lymphocytes Relative: 28 %
Lymphs Abs: 3 10*3/uL (ref 0.7–4.0)
MCH: 29.9 pg (ref 26.0–34.0)
MCHC: 34.3 g/dL (ref 30.0–36.0)
MCV: 87.3 fL (ref 80.0–100.0)
Monocytes Absolute: 1.1 10*3/uL — ABNORMAL HIGH (ref 0.1–1.0)
Monocytes Relative: 10 %
Neutro Abs: 6.5 10*3/uL (ref 1.7–7.7)
Neutrophils Relative %: 60 %
Platelets: 269 10*3/uL (ref 150–400)
RBC: 4.81 MIL/uL (ref 4.22–5.81)
RDW: 12.1 % (ref 11.5–15.5)
WBC: 10.8 10*3/uL — ABNORMAL HIGH (ref 4.0–10.5)
nRBC: 0 % (ref 0.0–0.2)

## 2020-02-20 NOTE — ED Notes (Addendum)
Notified by registration that pt left. Called name, no answer

## 2020-02-20 NOTE — ED Triage Notes (Addendum)
Pt arrived via POV with reports of cutting R index finger on metal cap from [place] bottle on Saturday. Pt states on Monday when he went to work and noticed swelling, was seen by UC this evening and was referred to ED.   Swelling and some redness noted at this time.   Pt went to [facility] ED tonight had labs drawn LWBS.

## 2020-02-20 NOTE — ED Triage Notes (Addendum)
Pt sts he cut his finger on a bottle cap 4 days ago.  Now right index finger is red and swollen   Pt was told by his MD to come to ED  Pt has also been running a low grade fever

## 2020-02-21 ENCOUNTER — Other Ambulatory Visit: Payer: Self-pay

## 2020-02-21 ENCOUNTER — Encounter: Payer: Self-pay | Admitting: Internal Medicine

## 2020-02-21 ENCOUNTER — Observation Stay
Admission: EM | Admit: 2020-02-21 | Discharge: 2020-02-22 | Disposition: A | Discharge status: Home / Self Care | Payer: BC Managed Care – PPO | Attending: Internal Medicine | Admitting: Internal Medicine

## 2020-02-21 DIAGNOSIS — J4520 Mild intermittent asthma, uncomplicated: Secondary | ICD-10-CM

## 2020-02-21 DIAGNOSIS — J45909 Unspecified asthma, uncomplicated: Secondary | ICD-10-CM | POA: Diagnosis present

## 2020-02-21 DIAGNOSIS — L03011 Cellulitis of right finger: Secondary | ICD-10-CM | POA: Diagnosis present

## 2020-02-21 DIAGNOSIS — F909 Attention-deficit hyperactivity disorder, unspecified type: Secondary | ICD-10-CM | POA: Diagnosis present

## 2020-02-21 LAB — BASIC METABOLIC PANEL
Anion gap: 7 (ref 5–15)
BUN: 12 mg/dL (ref 6–20)
CO2: 25 mmol/L (ref 22–32)
Calcium: 8.7 mg/dL — ABNORMAL LOW (ref 8.9–10.3)
Chloride: 102 mmol/L (ref 98–111)
Creatinine, Ser: 0.98 mg/dL (ref 0.61–1.24)
GFR, Estimated: >60 mL/min (ref >60)
Glucose, Bld: 110 mg/dL — ABNORMAL HIGH (ref 70–99)
Potassium: 3.6 mmol/L (ref 3.5–5.1)
Sodium: 134 mmol/L — ABNORMAL LOW (ref 135–145)

## 2020-02-21 LAB — RESP PANEL BY RT-PCR (FLU A&B, COVID) ARPGX2
Influenza A by PCR: NEGATIVE
Influenza B by PCR: NEGATIVE
SARS Coronavirus 2 by RT PCR: NEGATIVE

## 2020-02-21 LAB — HIV ANTIBODY (ROUTINE TESTING W REFLEX): HIV Screen 4th Generation wRfx: NONREACTIVE

## 2020-02-21 LAB — CBC WITH DIFFERENTIAL/PLATELET
Abs Immature Granulocytes: 0.04 10*3/uL (ref 0.00–0.07)
Basophils Absolute: 0 10*3/uL (ref 0.0–0.1)
Basophils Relative: 1 %
Eosinophils Absolute: 0.2 10*3/uL (ref 0.0–0.5)
Eosinophils Relative: 2 %
HCT: 39.3 % (ref 39.0–52.0)
Hemoglobin: 14.3 g/dL (ref 13.0–17.0)
Immature Granulocytes: 1 %
Lymphocytes Relative: 29 %
Lymphs Abs: 2.4 10*3/uL (ref 0.7–4.0)
MCH: 31.6 pg (ref 26.0–34.0)
MCHC: 36.4 g/dL — ABNORMAL HIGH (ref 30.0–36.0)
MCV: 86.8 fL (ref 80.0–100.0)
Monocytes Absolute: 0.8 10*3/uL (ref 0.1–1.0)
Monocytes Relative: 10 %
Neutro Abs: 4.9 10*3/uL (ref 1.7–7.7)
Neutrophils Relative %: 57 %
Platelets: 233 10*3/uL (ref 150–400)
RBC: 4.53 MIL/uL (ref 4.22–5.81)
RDW: 12.3 % (ref 11.5–15.5)
WBC: 8.4 10*3/uL (ref 4.0–10.5)
nRBC: 0 % (ref 0.0–0.2)

## 2020-02-21 LAB — APTT: aPTT: 26 seconds (ref 24–36)

## 2020-02-21 LAB — PROTIME-INR
INR: 1 (ref 0.8–1.2)
Prothrombin Time: 13.2 seconds (ref 11.4–15.2)

## 2020-02-21 LAB — C-REACTIVE PROTEIN: CRP: 3.9 mg/dL — ABNORMAL HIGH (ref <1.0)

## 2020-02-21 LAB — SEDIMENTATION RATE: Sed Rate: 17 mm/hr — ABNORMAL HIGH (ref 0–15)

## 2020-02-21 MED ORDER — VANCOMYCIN HCL IN DEXTROSE 1-5 GM/200ML-% IV SOLN: 1000.0000 mg | Freq: Three times a day (TID) | INTRAVENOUS | Status: DC

## 2020-02-21 MED ORDER — ALBUTEROL SULFATE HFA 108 (90 BASE) MCG/ACT IN AERS
2.0000 | INHALATION_SPRAY | RESPIRATORY_TRACT | Status: DC | PRN
  Filled 2020-02-21: qty 6.7

## 2020-02-21 MED ORDER — SODIUM CHLORIDE 0.9 % IV SOLN: INTRAVENOUS | Status: DC

## 2020-02-21 MED ORDER — SODIUM CHLORIDE 0.9 % IV BOLUS
1000.0000 mL | Freq: Once | INTRAVENOUS | Status: AC
  Administered 2020-02-21: 1000 mL via INTRAVENOUS

## 2020-02-21 MED ORDER — SODIUM CHLORIDE 0.9 % IV SOLN
1.0000 g | INTRAVENOUS | Status: DC
  Administered 2020-02-21 – 2020-02-22 (×2): 1 g via INTRAVENOUS
  Filled 2020-02-21 (×3): qty 10

## 2020-02-21 MED ORDER — MORPHINE SULFATE (PF) 2 MG/ML IV SOLN
2.0000 mg | INTRAVENOUS | Status: DC | PRN
  Administered 2020-02-21: 2 mg via INTRAVENOUS
  Filled 2020-02-21: qty 1

## 2020-02-21 MED ORDER — DM-GUAIFENESIN ER 30-600 MG PO TB12
1.0000 | ORAL_TABLET | Freq: Two times a day (BID) | ORAL | Status: DC | PRN
  Filled 2020-02-21: qty 1

## 2020-02-21 MED ORDER — MORPHINE SULFATE (PF) 4 MG/ML IV SOLN
4.0000 mg | Freq: Once | INTRAVENOUS | Status: AC
  Administered 2020-02-21: 4 mg via INTRAVENOUS
  Filled 2020-02-21: qty 1

## 2020-02-21 MED ORDER — TETANUS-DIPHTH-ACELL PERTUSSIS 5-2.5-18.5 LF-MCG/0.5 IM SUSY
0.5000 mL | PREFILLED_SYRINGE | Freq: Once | INTRAMUSCULAR | Status: AC
  Administered 2020-02-21: 0.5 mL via INTRAMUSCULAR
  Filled 2020-02-21: qty 0.5

## 2020-02-21 MED ORDER — ONDANSETRON HCL 4 MG/2ML IJ SOLN: 4.0000 mg | Freq: Three times a day (TID) | INTRAMUSCULAR | Status: DC | PRN

## 2020-02-21 MED ORDER — OXYCODONE-ACETAMINOPHEN 5-325 MG PO TABS
1.0000 | ORAL_TABLET | Freq: Four times a day (QID) | ORAL | Status: DC | PRN
  Administered 2020-02-21 (×2): 1 via ORAL
  Filled 2020-02-21 (×2): qty 1

## 2020-02-21 MED ORDER — VANCOMYCIN HCL IN DEXTROSE 1-5 GM/200ML-% IV SOLN
1000.0000 mg | Freq: Once | INTRAVENOUS | Status: AC
  Administered 2020-02-21: 1000 mg via INTRAVENOUS
  Filled 2020-02-21: qty 200

## 2020-02-21 MED ORDER — SODIUM CHLORIDE 0.9 % IV SOLN
1.5000 g | Freq: Once | INTRAVENOUS | Status: AC
  Administered 2020-02-21: 1.5 g via INTRAVENOUS
  Filled 2020-02-21: qty 4

## 2020-02-21 MED ORDER — DIPHENHYDRAMINE HCL 25 MG PO CAPS: 25.0000 mg | ORAL_CAPSULE | Freq: Four times a day (QID) | ORAL | Status: DC | PRN

## 2020-02-21 MED ORDER — ACETAMINOPHEN 325 MG PO TABS: 650.0000 mg | ORAL_TABLET | Freq: Four times a day (QID) | ORAL | Status: DC | PRN

## 2020-02-21 MED ORDER — SENNOSIDES-DOCUSATE SODIUM 8.6-50 MG PO TABS: 1.0000 | ORAL_TABLET | Freq: Every evening | ORAL | Status: DC | PRN

## 2020-02-21 MED ORDER — SODIUM CHLORIDE 0.9 % IV SOLN: 1.0000 g | INTRAVENOUS | Status: DC

## 2020-02-21 NOTE — ED Notes (Signed)
Pt cut right index finger with metal bottle on 02/17/2020

## 2020-02-21 NOTE — Consult Note (Addendum)
ORTHOPAEDIC CONSULTATION  REQUESTING PHYSICIAN: Ivor Costa, MD  Chief Complaint: finger pain  HPI: Derrick Mayer is a 19 y.o. male who complains of right index finger pain and swelling since Monday. Please see H&P and ED notes for details. Denies any numbness, tingling or constitutional symptoms.  Past Medical History:  Diagnosis Date  . ADHD (attention deficit hyperactivity disorder)   . Asthma   . Attention deficit hyperactivity disorder (ADHD) 05/24/2015  . Cancer ([place])   . Cannabis abuse 05/20/2015  . Collagen vascular disease ([place])   . Depressive disorder 05/20/2015  . History of ADHD 05/20/2015  . Substance induced mood disorder ([place]) 05/20/2015   Past Surgical History:  Procedure Laterality Date  . INCISION AND DRAINAGE ABSCESS N/A 04/13/2018   Procedure: INCISION AND DRAINAGE ABSCESS;  Surgeon: Benjamine Sprague, DO;  Location: [facility] ORS;  Service: General;  Laterality: N/A;  . WISDOM TOOTH EXTRACTION     Social History   Socioeconomic History  . Marital status: Single    Spouse name: Not on file  . Number of children: Not on file  . Years of education: Not on file  . Highest education level: Not on file  Occupational History  . Not on file  Tobacco Use  . Smoking status: Never Smoker  . Smokeless tobacco: Never Used  Substance and Sexual Activity  . Alcohol use: No  . Drug use: Yes    Types: Marijuana  . Sexual activity: Yes    Birth control/protection: Condom  Other Topics Concern  . Not on file  Social History Narrative   ** Merged History Encounter **       Social Determinants of Health   Financial Resource Strain: Not on file  Food Insecurity: Not on file  Transportation Needs: Not on file  Physical Activity: Not on file  Stress: Not on file  Social Connections: Not on file   History reviewed. No pertinent family history. Allergies  Allergen Reactions  . Vancomycin     Itches    Prior to Admission medications   Not on File   DG Finger Index  Right  Result Date: 02/20/2020 CLINICAL DATA:  Swelling cut EXAM: RIGHT INDEX FINGER 2+V COMPARISON:  None. FINDINGS: No fracture or malalignment. Diffuse soft tissue swelling. No radiopaque foreign body or soft tissue emphysema. No periostitis or bone destruction IMPRESSION: Soft tissue swelling. No acute osseous abnormality. Electronically Signed   By: Donavan Foil M.D.   On: 02/20/2020 20:45    Positive ROS: All other systems have been reviewed and were otherwise negative with the exception of those mentioned in the HPI and as above.  Physical Exam: General: Alert, no acute distress Cardiovascular: No pedal edema Respiratory: No cyanosis, no use of accessory musculature GI: No organomegaly, abdomen is soft and non-tender  Neurologic: Sensation intact distally Psychiatric: Patient is competent for consent with normal mood and affect Lymphatic: No axillary or cervical lymphadenopathy  MUSCULOSKELETAL: right index with swelling, erythema, eschar volar without drainage. Pain with passive/active ROM. Compartments soft. Good cap refill. Motor and sensory intact distally.  Assessment: Right index finger infection  Plan: Continue IV antibiotics for 24 hours, then OK for discharge on oral antibiotics doxycycline and clindamycin. We will see in the office on Friday at [facility] for repeat evaluation. Elevate hand on pillows. He may have a diet, but NPO Friday morning as a precaution.  Please call with questions.    Lovell Sheehan, MD    02/21/2020 10:05 AM

## 2020-02-21 NOTE — ED Notes (Signed)
Patient given lunch tray.

## 2020-02-21 NOTE — H&P (Signed)
History and Physical    Caliber Landess MLJ:[id number] DOB: [birth year] DOA: 02/21/2020  Referring MD/NP/PA:   PCP: Patient, No Pcp Per   Patient coming from:  The patient is coming from home.  At baseline, pt is independent for most of ADL.        Chief Complaint: right index finger injury and pain  HPI: Derrick Mayer is a 19 y.o. male with medical history significant of asthma, ADHD, depression, marijuana abuse, who presents with right index finger injury and pain.  Patient states that she injured his right index finger when he was opening a beer bottle 4 days ago. He cut to the underside of his right index finger on the right hand. Since then, he has had increasing pain and swelling throughout his right index finger. The pain is constant, severe, sharp, nonradiating.  Patient denies fever or chills. He was initially evaluated at an urgent care yesterday evening, subsequently referred to the ED for further evaluation.  Patient does not have chest pain, cough, shortness breath, nausea, vomiting, diarrhea or abdominal pain.  No symptoms of UTI. Pt was started on IV Unasyn and vancomycin in ED.  Patient developed itchiness in head and posterior neck without rash while receiving vancomycin infusion.  Vancomycin was discontinued.  ED Course: pt was found to have WBC 8.4, negative COVID-19 PCR, electrolytes renal function okay, temperature 99.3, blood pressure 120/62, heart rate 95, RR 18, oxygen saturation 98% on room air.  X-ray of right index finger is negative for foreign body or bony fracture.  Patient is admitted to [facility] bed as inpatient.  Dr. Harlow Mares of Ortho is consulted.  Review of Systems:   General: no fevers, chills, no body weight gain, fatigue HEENT: no blurry vision, hearing changes or sore throat Respiratory: no dyspnea, coughing, wheezing CV: no chest pain, no palpitations GI: no nausea, vomiting, abdominal pain, diarrhea, constipation GU: no dysuria, burning on urination,  increased urinary frequency, hematuria  Ext: no leg edema Neuro: no unilateral weakness, numbness, or tingling, no vision change or hearing loss Skin: no rash, no skin tear. Has swelling, severe pain and injury to right index finger MSK: No muscle spasm, no deformity, no limitation of range of movement in spin Heme: No easy bruising.  Travel history: No recent long distant travel.  Allergy:  Allergies  Allergen Reactions  . Vancomycin     Itches     Past Medical History:  Diagnosis Date  . ADHD (attention deficit hyperactivity disorder)   . Asthma   . Attention deficit hyperactivity disorder (ADHD) 05/24/2015  . Cancer ([place])   . Cannabis abuse 05/20/2015  . Collagen vascular disease ([place])   . Depressive disorder 05/20/2015  . History of ADHD 05/20/2015  . Substance induced mood disorder ([place]) 05/20/2015    Past Surgical History:  Procedure Laterality Date  . INCISION AND DRAINAGE ABSCESS N/A 04/13/2018   Procedure: INCISION AND DRAINAGE ABSCESS;  Surgeon: Benjamine Sprague, DO;  Location: [facility] ORS;  Service: General;  Laterality: N/A;  . WISDOM TOOTH EXTRACTION      Social History:  reports that he has never smoked. He has never used smokeless tobacco. He reports current drug use. Drug: Marijuana. He reports that he does not drink alcohol.  Family History: Reviewed with patient, but patient states that all family members do not have significant medical history.  Prior to Admission medications   Not on File    Physical Exam: Vitals:   02/20/20 2026 02/21/20 0151 02/21/20 0610 02/21/20 0900  BP:  122/86 120/62 117/66  Pulse:  86 92 83  Resp:  18 16   Temp:  98.6 F (37 C) 97.7 F (36.5 C)   TempSrc:  Oral Oral   SpO2:  98%  98%  Weight: 90.7 kg     Height: _0  (1.702 m)      General: Not in acute distress HEENT:       Eyes: PERRL, EOMI, no scleral icterus.       ENT: No discharge from the ears and nose, no pharynx injection, no tonsillar enlargement.        Neck: No  JVD, no bruit, no mass felt. Heme: No neck lymph node enlargement. Cardiac: S1/S2, RRR, No murmurs, No gallops or rubs. Respiratory: No rales, wheezing, rhonchi or rubs. GI: Soft, nondistended, nontender, no rebound pain, no organomegaly, BS present. GU: No hematuria Ext: No pitting leg edema bilaterally. 2+DP/PT pulse bilaterally. Musculoskeletal: please see the picture in EDP's note. Has severe tenderness, circumferential swelling, erythema, warmth in entire right index finger. Patient is unable to fully extend finger due to pain.  Skin: No rashes.  Neuro: Alert, oriented X3, cranial nerves II-XII grossly intact, moves all extremities normally. Psych: Patient is not psychotic, no suicidal or hemocidal ideation.  Labs on Admission: I have personally reviewed following labs and imaging studies  CBC: Recent Labs  Lab 02/20/20 1856 02/21/20 0539  WBC 10.8* 8.4  NEUTROABS 6.5 4.9  HGB 14.4 14.3  HCT 42.0 39.3  MCV 87.3 86.8  PLT 269 549   Basic Metabolic Panel: Recent Labs  Lab 02/20/20 1856 02/21/20 0539  NA 135 134*  K 3.4* 3.6  CL 103 102  CO2 23 25  GLUCOSE 121* 110*  BUN 10 12  CREATININE 1.01 0.98  CALCIUM 9.0 8.7*   GFR: Estimated Creatinine Clearance: 128 mL/min (by C-G formula based on SCr of 0.98 mg/dL). Liver Function Tests: Recent Labs  Lab 02/20/20 1856  AST 21  ALT 61*  ALKPHOS 99  BILITOT 0.8  PROT 7.5  ALBUMIN 3.9   No results for input(s): LIPASE, AMYLASE in the last 168 hours. No results for input(s): AMMONIA in the last 168 hours. Coagulation Profile: No results for input(s): INR, PROTIME in the last 168 hours. Cardiac Enzymes: No results for input(s): CKTOTAL, CKMB, CKMBINDEX, TROPONINI in the last 168 hours. BNP (last 3 results) No results for input(s): PROBNP in the last 8760 hours. HbA1C: No results for input(s): HGBA1C in the last 72 hours. CBG: No results for input(s): GLUCAP in the last 168 hours. Lipid Profile: No results for  input(s): CHOL, HDL, LDLCALC, TRIG, CHOLHDL, LDLDIRECT in the last 72 hours. Thyroid Function Tests: No results for input(s): TSH, T4TOTAL, FREET4, T3FREE, THYROIDAB in the last 72 hours. Anemia Panel: No results for input(s): VITAMINB12, FOLATE, FERRITIN, TIBC, IRON, RETICCTPCT in the last 72 hours. Urine analysis:    Component Value Date/Time   COLORURINE YELLOW 04/01/2016 0327   APPEARANCEUR HAZY (A) 04/01/2016 0327   LABSPEC 1.019 04/01/2016 0327   PHURINE 6.0 04/01/2016 0327   GLUCOSEU NEGATIVE 04/01/2016 0327   HGBUR NEGATIVE 04/01/2016 0327   BILIRUBINUR NEGATIVE 04/01/2016 0327   KETONESUR NEGATIVE 04/01/2016 0327   PROTEINUR NEGATIVE 04/01/2016 0327   UROBILINOGEN 1.0 06/24/2007 1559   NITRITE NEGATIVE 04/01/2016 0327   LEUKOCYTESUR NEGATIVE 04/01/2016 0327   Sepsis Labs: _1 (procalcitonin:4,lacticidven:4) ) Recent Results (from the past 240 hour(s))  Resp Panel by RT-PCR (Flu A&B, Covid) Nasopharyngeal Swab     Status:  None   Collection Time: 02/21/20  5:39 AM   Specimen: Nasopharyngeal Swab; Nasopharyngeal(NP) swabs in vial transport medium  Result Value Ref Range Status   SARS Coronavirus 2 by RT PCR NEGATIVE NEGATIVE Final    Comment: (NOTE) SARS-CoV-2 target nucleic acids are NOT DETECTED.  The SARS-CoV-2 RNA is generally detectable in upper respiratory specimens during the acute phase of infection. The lowest concentration of SARS-CoV-2 viral copies this assay can detect is 138 copies/mL. A negative result does not preclude SARS-Cov-2 infection and should not be used as the sole basis for treatment or other patient management decisions. A negative result may occur with  improper specimen collection/handling, submission of specimen other than nasopharyngeal swab, presence of viral mutation(s) within the areas targeted by this assay, and inadequate number of viral copies(<138 copies/mL). A negative result must be combined with clinical observations,  patient history, and epidemiological information. The expected result is Negative.  Fact Sheet for Patients:  EntrepreneurPulse.com.au  Fact Sheet for Healthcare Providers:  IncredibleEmployment.be  This test is no t yet approved or cleared by the [place] FDA and  has been authorized for detection and/or diagnosis of SARS-CoV-2 by FDA under an Emergency Use Authorization (EUA). This EUA will remain  in effect (meaning this test can be used) for the duration of the COVID-19 declaration under Section 564(b)(1) of the Act, 21 U.S.C.section 360bbb-3(b)(1), unless the authorization is terminated  or revoked sooner.       Influenza A by PCR NEGATIVE NEGATIVE Final   Influenza B by PCR NEGATIVE NEGATIVE Final    Comment: (NOTE) The Xpert Xpress SARS-CoV-2/FLU/RSV plus assay is intended as an aid in the diagnosis of influenza from Nasopharyngeal swab specimens and should not be used as a sole basis for treatment. Nasal washings and aspirates are unacceptable for Xpert Xpress SARS-CoV-2/FLU/RSV testing.  Fact Sheet for Patients: EntrepreneurPulse.com.au  Fact Sheet for Healthcare Providers: IncredibleEmployment.be  This test is not yet approved or cleared by the [place] FDA and has been authorized for detection and/or diagnosis of SARS-CoV-2 by FDA under an Emergency Use Authorization (EUA). This EUA will remain in effect (meaning this test can be used) for the duration of the COVID-19 declaration under Section 564(b)(1) of the Act, 21 U.S.C. section 360bbb-3(b)(1), unless the authorization is terminated or revoked.  Performed at [facility], [address] [place], [place] [address]      Radiological Exams on Admission: DG Finger Index Right  Result Date: 02/20/2020 CLINICAL DATA:  Swelling cut EXAM: RIGHT INDEX FINGER 2+V COMPARISON:  None. FINDINGS: No fracture or malalignment.  Diffuse soft tissue swelling. No radiopaque foreign body or soft tissue emphysema. No periostitis or bone destruction IMPRESSION: Soft tissue swelling. No acute osseous abnormality. Electronically Signed   By: Donavan Foil M.D.   On: 02/20/2020 20:45     EKG: Not done in ED, will get one.   Assessment/Plan Principal Problem:   Cellulitis of right index finger Active Problems:   ADHD (attention deficit hyperactivity disorder)   Asthma   Cellulitis of right index finger: pt has severe tenderness, erythema, warmth and swelling in the entire right index finger, concerning for flexor tenosynovitis.  Dr. Harlow Mares of orthopedic surgery is consulted.  Patient received 1 dose of Unasyn, and he developed possible allergy while receiving vancomycin.  Will switch to Rocephin.  - Admitted to MedSurg bed as inpatient - Empiric antimicrobial treatment with Rocephin - PRN Zofran for nausea, morphine and Percocet for pain - Blood cultures  x 2  - ESR and CRP - IVF: 1.0 L of NS bolus in ED, followed by 100 cc/h - INR/PTT  ADHD (attention deficit hyperactivity disorder): not taking meds at home. No acute issues -observe closely  Asthma: stable -prn albuterol inhaler   DVT ppx: SCD Code Status: Full code Family Communication: not done, no family member is at bed side.   Disposition Plan:  Anticipate discharge back to previous environment Consults called:  Dr. Harlow Mares of ortho Admission status: Med-surg bed as inpt       Status is: Inpatient  Remains inpatient appropriate because:Inpatient level of care appropriate due to severity of illness.  Patient has multiple comorbidities, now presents with cellulitis of her right index finger, concerning for possible flexor tenosynovitis.  His presentation is highly complicated.  Will likely need I&D.  His presentation is highly complicated. Will need to be treated in hospital for at least 2 days.   Dispo: The patient is from: Home               Anticipated d/c is to: Home              Anticipated d/c date is: 2 days              Patient currently is not medically stable to d/c.          Date of Service 02/21/2020    [facility]   If 7PM-7AM, please contact night-coverage [email or web address] 02/21/2020, 9:07 AM

## 2020-02-21 NOTE — ED Notes (Signed)
Report given to Chrissie RN

## 2020-02-21 NOTE — Consult Note (Signed)
Pharmacy Antibiotic Note  Derrick Mayer is a 19 y.o. male admitted on 02/21/2020 with cellulitis.  Pharmacy has been consulted for Vancomycin dosing.  Plan: Vancomycin 1000 IV every 8 hours.  Goal trough 10-15 mcg/mL.  Height: 5\' 7"  (170.2 cm) Weight: 90.7 kg (200 lb) IBW/kg (Calculated) : 66.1  Temp (24hrs), Avg:98.6 F (37 C), Min:97.7 F (36.5 C), Max:99.3 F (37.4 C)  Recent Labs  Lab 02/20/20 1856 02/21/20 0539  WBC 10.8* 8.4  CREATININE 1.01 0.98    Estimated Creatinine Clearance: 128 mL/min (by C-G formula based on SCr of 0.98 mg/dL).    No Known Allergies  Antimicrobials this admission: Unasyn 12/15 x 1 Vancomycin 12/15 >>  Dose adjustments this admission: None  Microbiology results: 12/15 BCx pending  Thank you for allowing pharmacy to be a part of this patient's care.  Lu Duffel, PharmD, BCPS Clinical Pharmacist 02/21/2020 8:22 AM '

## 2020-02-21 NOTE — ED Notes (Signed)
Patient ambulated to and from hallway bathroom with a steady gait. Patient given saltines and sprite per request. Patient c/o right finger pain, but wanted to eat before being given pain meds. Breakfast tray was at nursing desk and was cold. Patient declined that tray at this time.

## 2020-02-21 NOTE — ED Notes (Signed)
Patient is sleeping in a darkened room. No NAD.

## 2020-02-21 NOTE — ED Notes (Signed)
Patient has eaten [employer] brought by brother.

## 2020-02-21 NOTE — ED Notes (Signed)
Repeat Blood work collected by this Therapist, sports, maintenance fluids initiated by this Therapist, sports. EKG obtained by this RN. Call bell remains within reach of patient at this time. Pt c/o increased pain at this time.

## 2020-02-21 NOTE — ED Notes (Signed)
Blood pressure cuff moved from right arm placed by previous RN to left arm for patient's comfort. Patient is alert and oriented.

## 2020-02-21 NOTE — ED Provider Notes (Signed)
[facility] Emergency Department Provider Note   ____________________________________________   Event Date/Time   First MD Initiated Contact with Patient 02/21/20 [phone]     (approximate)  I have reviewed the triage vital signs and the nursing notes.   HISTORY  Chief Complaint Finger Injury    HPI Derrick Mayer is a 19 y.o. male with past medical history of asthma who presents to the ED complaining of finger injury.  Patient reports that 4 days ago he was opening a beer bottle in the middle Cut to the underside of his pointer finger on the right hand.  Since then, he has had increasing pain and swelling throughout his right first digit with difficulty moving the digit due to pain.  He denies any fevers, chills, nausea, or vomiting.  He was initially evaluated at an urgent care this evening, subsequently referred to the ED for further evaluation.  Patient is unsure of his last tetanus shot.        Past Medical History:  Diagnosis Date  . ADHD (attention deficit hyperactivity disorder)   . Asthma   . Attention deficit hyperactivity disorder (ADHD) 05/24/2015  . Cancer ([place])   . Cannabis abuse 05/20/2015  . Collagen vascular disease ([place])   . Depressive disorder 05/20/2015  . History of ADHD 05/20/2015  . Substance induced mood disorder ([place]) 05/20/2015    Patient Active Problem List   Diagnosis Date Noted  . Perirectal abscess 04/13/2018  . Attention deficit hyperactivity disorder (ADHD) 05/24/2015  . Cannabis abuse 05/20/2015  . Depressive disorder 05/20/2015  . Substance induced mood disorder ([place]) 05/20/2015  . ADHD (attention deficit hyperactivity disorder) 10/30/2012  . Knee contusion 09/29/2012  . Left knee sprain 09/13/2012  . Abrasion of left leg 06/20/2012    Past Surgical History:  Procedure Laterality Date  . INCISION AND DRAINAGE ABSCESS N/A 04/13/2018   Procedure: INCISION AND DRAINAGE ABSCESS;  Surgeon: Benjamine Sprague, DO;  Location:  [facility] ORS;  Service: General;  Laterality: N/A;  . WISDOM TOOTH EXTRACTION      Prior to Admission medications   Medication Sig Start Date End Date Taking? Authorizing Provider  ibuprofen (ADVIL,MOTRIN) 800 MG tablet Take 1 tablet (800 mg total) by mouth every 8 (eight) hours as needed for mild pain or moderate pain. 04/13/18   Benjamine Sprague, DO    Allergies Patient has no known allergies.  History reviewed. No pertinent family history.  Social History Social History   Tobacco Use  . Smoking status: Never Smoker  . Smokeless tobacco: Never Used  Substance Use Topics  . Alcohol use: No  . Drug use: No    Types: Marijuana    Review of Systems  Constitutional: No fever/chills Eyes: No visual changes. ENT: No sore throat. Cardiovascular: Denies chest pain. Respiratory: Denies shortness of breath. Gastrointestinal: No abdominal pain.  No nausea, no vomiting.  No diarrhea.  No constipation. Genitourinary: Negative for dysuria. Musculoskeletal: Negative for back pain.  Positive for finger pain and swelling. Skin: Negative for rash. Neurological: Negative for headaches, focal weakness or numbness.  ____________________________________________   PHYSICAL EXAM:  VITAL SIGNS: ED Triage Vitals  Enc Vitals Group     BP 02/20/20 2025 (!) 143/77     Pulse Rate 02/20/20 2025 80     Resp 02/20/20 2025 16     Temp 02/20/20 2025 98.8 F (37.1 C)     Temp Source 02/20/20 2025 Oral     SpO2 02/20/20 2025 97 %  Weight 02/20/20 2026 200 lb (90.7 kg)     Height 02/20/20 2026 5\' 7"  (1.702 m)     Head Circumference --      Peak Flow --      Pain Score 02/20/20 2025 8     Pain Loc --      Pain Edu? --      Excl. in [place]? --     Constitutional: Alert and oriented. Eyes: Conjunctivae are normal. Head: Atraumatic. Nose: No congestion/rhinnorhea. Mouth/Throat: Mucous membranes are moist. Neck: Normal ROM Cardiovascular: Normal rate, regular rhythm. Grossly normal heart  sounds. Respiratory: Normal respiratory effort.  No retractions. Lungs CTAB. Gastrointestinal: Soft and nontender. No distention. Genitourinary: deferred Musculoskeletal: No lower extremity tenderness nor edema.  Circumferential edema, erythema, and warmth of entire right first finger.  Diffuse tenderness to palpation, greatest on the underside of the finger.  Patient unable to fully extend finger due to pain.  See picture below. Neurologic:  Normal speech and language. No gross focal neurologic deficits are appreciated. Skin:  Skin is warm, dry and intact. No rash noted. Psychiatric: Mood and affect are normal. Speech and behavior are normal.        ____________________________________________   LABS (all labs ordered are listed, but only abnormal results are displayed)  Labs Reviewed  RESP PANEL BY RT-PCR (FLU A&B, COVID) ARPGX2  CBC WITH DIFFERENTIAL/PLATELET  BASIC METABOLIC PANEL     PROCEDURES  Procedure(s) performed (including Critical Care):  Procedures   ____________________________________________   INITIAL IMPRESSION / ASSESSMENT AND PLAN / ED COURSE       19 year old male with no significant past medical history presents to the ED complaining of pain and swelling over his entire right first finger worsening over the past 4 days after he cut it on a metal bottle cap.  Patient with edematous right first finger with associated erythema and warmth concerning for infection.  Circumferential edema along with difficulty extending his finger is concerning for possible flexor tenosynovitis.  We will start patient on Unasyn and vancomycin.  X-ray reviewed by me and shows no obvious foreign body or fracture.  He had [facility] into [facility] emergency department yesterday but left without being seen, labs reviewed from that time and unremarkable.  Case discussed with Dr. Harlow Mares of orthopedics and will have patient admitted with plan for orthopedics to evaluate later this  morning.  Case discussed with hospitalist for admission.      ____________________________________________   FINAL CLINICAL IMPRESSION(S) / ED DIAGNOSES  Final diagnoses:  Cellulitis of finger of right hand     ED Discharge Orders    None       Note:  This document was prepared using Dragon voice recognition software and may include unintentional dictation errors.   Blake Divine, MD 02/21/20 [phone]

## 2020-02-21 NOTE — ED Notes (Signed)
Pt brought to room 36 by this RN. Pt states continued pain 9/10 after administration of 2mg  IV morphine. Pt A&O x4, call bell placed within reach of patient at this time.

## 2020-02-22 DIAGNOSIS — L03011 Cellulitis of right finger: Secondary | ICD-10-CM | POA: Diagnosis not present

## 2020-02-22 LAB — CBC
HCT: 39 % (ref 39.0–52.0)
Hemoglobin: 13.8 g/dL (ref 13.0–17.0)
MCH: 30.9 pg (ref 26.0–34.0)
MCHC: 35.4 g/dL (ref 30.0–36.0)
MCV: 87.2 fL (ref 80.0–100.0)
Platelets: 241 10*3/uL (ref 150–400)
RBC: 4.47 MIL/uL (ref 4.22–5.81)
RDW: 12.1 % (ref 11.5–15.5)
WBC: 6.5 10*3/uL (ref 4.0–10.5)
nRBC: 0 % (ref 0.0–0.2)

## 2020-02-22 LAB — BASIC METABOLIC PANEL
Anion gap: 7 (ref 5–15)
BUN: 10 mg/dL (ref 6–20)
CO2: 23 mmol/L (ref 22–32)
Calcium: 8.5 mg/dL — ABNORMAL LOW (ref 8.9–10.3)
Chloride: 109 mmol/L (ref 98–111)
Creatinine, Ser: 0.82 mg/dL (ref 0.61–1.24)
GFR, Estimated: >60 mL/min (ref >60)
Glucose, Bld: 101 mg/dL — ABNORMAL HIGH (ref 70–99)
Potassium: 4.1 mmol/L (ref 3.5–5.1)
Sodium: 139 mmol/L (ref 135–145)

## 2020-02-22 MED ORDER — IBUPROFEN 600 MG PO TABS: 600.0000 mg | ORAL_TABLET | Freq: Three times a day (TID) | ORAL | 0 refills | Status: DC | PRN

## 2020-02-22 MED ORDER — DOXYCYCLINE MONOHYDRATE 100 MG PO TABS: 100.0000 mg | ORAL_TABLET | Freq: Two times a day (BID) | ORAL | 0 refills | Status: DC

## 2020-02-22 NOTE — Discharge Summary (Signed)
Physician Discharge Summary  Derrick Mayer WUJ:[id number] DOB: 2000-08-30 DOA: 02/21/2020  PCP: Patient, No Pcp Per  Admit date: 02/21/2020 Discharge date: 02/22/2020  Admitted From: Home Disposition:  Home  Discharge Condition:Stable CODE STATUS:FULL Diet recommendation: Regular   Brief/Interim Summary: HPI: Derrick Mayer is a 19 y.o. male with medical history significant of asthma, ADHD, depression, marijuana abuse, who presents with right index finger injury and pain.  Patient states that she injured his right index finger when he was opening a beer bottle 4 days ago. He cut to the underside of his right index finger on the right hand. Since then, he has had increasing pain and swelling throughout his right index finger. The pain is constant, severe, sharp, nonradiating.  Patient denies fever or chills. He was initially evaluated at an urgent care yesterday evening, subsequently referred to the ED for further evaluation.  Patient does not have chest pain, cough, shortness breath, nausea, vomiting, diarrhea or abdominal pain.  No symptoms of UTI. Pt was started on IV Unasyn and vancomycin in ED.  Patient developed itchiness in head and posterior neck without rash while receiving vancomycin infusion.  Vancomycin was discontinued.  ED Course: pt was found to have WBC 8.4, negative COVID-19 PCR, electrolytes renal function okay, temperature 99.3, blood pressure 120/62, heart rate 95, RR 18, oxygen saturation 98% on room air.  X-ray of right index finger is negative for foreign body or bony fracture.  Patient is admitted to [facility] bed as inpatient.  Dr. Harlow Mares of Ortho is consulted.  Hospital Course: His hospital course has remained stable.  This morning he was hemodynamically stable, afebrile.  Right hand swelling has significantly improved.  Pain has improved.  He was seen by orthopedics and cleared for discharge with oral antibiotics to follow-up at the office tomorrow at 2 PM.  Patient is  medically stable for discharge.  He will be discharged on oral doxycycline.    Discharge Diagnoses:  Principal Problem:   Cellulitis of right index finger Active Problems:   ADHD (attention deficit hyperactivity disorder)   Asthma    Discharge Instructions  Discharge Instructions    Diet general   Complete by: As directed    Discharge instructions   Complete by: As directed    1)Please take prescribed medications as instructed 2) follow-up with orthopedics office tomorrow at 2 PM.  Name and number of the provider has been attached. 3)Elevate your hand   Increase activity slowly   Complete by: As directed      Allergies as of 02/22/2020      Reactions   Vancomycin    Itches      Medication List    TAKE these medications   doxycycline 100 MG tablet Commonly known as: ADOXA Take 1 tablet (100 mg total) by mouth 2 (two) times daily.   ibuprofen 600 MG tablet Commonly known as: ADVIL Take 1 tablet (600 mg total) by mouth every 8 (eight) hours as needed.       Follow-up Information    Carlynn Spry, PA-C. Schedule an appointment as soon as possible for a visit in 1 day(s).   Specialty: Orthopedic Surgery Why: make appointment at 2 pm  tomorrow Contact information: [address] [place] [address] [phone]              Allergies  Allergen Reactions  . Vancomycin     Itches     Consultations:  Orthopedics   Procedures/Studies: DG Finger Index Right  Result Date:  02/20/2020 CLINICAL DATA:  Swelling cut EXAM: RIGHT INDEX FINGER 2+V COMPARISON:  None. FINDINGS: No fracture or malalignment. Diffuse soft tissue swelling. No radiopaque foreign body or soft tissue emphysema. No periostitis or bone destruction IMPRESSION: Soft tissue swelling. No acute osseous abnormality. Electronically Signed   By: Donavan Foil M.D.   On: 02/20/2020 20:45      Subjective:  Patient seen and examined the bedside this morning.  Medically stable for  discharge today. Discharge Exam: Vitals:   02/22/20 0425 02/22/20 0754  BP: 113/67 121/66  Pulse: (!) 57 72  Resp:  16  Temp: 97.7 F (36.5 C)   SpO2: 98% 100%   Vitals:   02/21/20 1948 02/21/20 2356 02/22/20 0425 02/22/20 0754  BP: 130/76 114/67 113/67 121/66  Pulse: 71 62 (!) 57 72  Resp: 17 17  16   Temp: 97.8 F (36.6 C) 98 F (36.7 C) 97.7 F (36.5 C)   TempSrc:   Oral   SpO2: 98% 98% 98% 100%  Weight:      Height:        General: Pt is alert, awake, not in acute distress Cardiovascular: RRR, S1/S2 +, no rubs, no gallops Respiratory: CTA bilaterally, no wheezing, no rhonchi Abdominal: Soft, NT, ND, bowel sounds + Extremities: no edema, no cyanosis, improving  right hand swelling and tenderness    The results of significant diagnostics from this hospitalization (including imaging, microbiology, ancillary and laboratory) are listed below for reference.     Microbiology: Recent Results (from the past 240 hour(s))  Resp Panel by RT-PCR (Flu A&B, Covid) Nasopharyngeal Swab     Status: None   Collection Time: 02/21/20  5:39 AM   Specimen: Nasopharyngeal Swab; Nasopharyngeal(NP) swabs in vial transport medium  Result Value Ref Range Status   SARS Coronavirus 2 by RT PCR NEGATIVE NEGATIVE Final    Comment: (NOTE) SARS-CoV-2 target nucleic acids are NOT DETECTED.  The SARS-CoV-2 RNA is generally detectable in upper respiratory specimens during the acute phase of infection. The lowest concentration of SARS-CoV-2 viral copies this assay can detect is 138 copies/mL. A negative result does not preclude SARS-Cov-2 infection and should not be used as the sole basis for treatment or other patient management decisions. A negative result may occur with  improper specimen collection/handling, submission of specimen other than nasopharyngeal swab, presence of viral mutation(s) within the areas targeted by this assay, and inadequate number of viral copies(<138 copies/mL). A  negative result must be combined with clinical observations, patient history, and epidemiological information. The expected result is Negative.  Fact Sheet for Patients:  EntrepreneurPulse.com.au  Fact Sheet for Healthcare Providers:  IncredibleEmployment.be  This test is no t yet approved or cleared by the [place] FDA and  has been authorized for detection and/or diagnosis of SARS-CoV-2 by FDA under an Emergency Use Authorization (EUA). This EUA will remain  in effect (meaning this test can be used) for the duration of the COVID-19 declaration under Section 564(b)(1) of the Act, 21 U.S.C.section 360bbb-3(b)(1), unless the authorization is terminated  or revoked sooner.       Influenza A by PCR NEGATIVE NEGATIVE Final   Influenza B by PCR NEGATIVE NEGATIVE Final    Comment: (NOTE) The Xpert Xpress SARS-CoV-2/FLU/RSV plus assay is intended as an aid in the diagnosis of influenza from Nasopharyngeal swab specimens and should not be used as a sole basis for treatment. Nasal washings and aspirates are unacceptable for Xpert Xpress SARS-CoV-2/FLU/RSV testing.  Fact Sheet for Patients: EntrepreneurPulse.com.au  Fact Sheet for Healthcare Providers: IncredibleEmployment.be  This test is not yet approved or cleared by the [place] FDA and has been authorized for detection and/or diagnosis of SARS-CoV-2 by FDA under an Emergency Use Authorization (EUA). This EUA will remain in effect (meaning this test can be used) for the duration of the COVID-19 declaration under Section 564(b)(1) of the Act, 21 U.S.C. section 360bbb-3(b)(1), unless the authorization is terminated or revoked.  Performed at [facility], [address] [place], [place] [address]   Culture, blood (Routine X 2) w Reflex to ID Panel     Status: None (Preliminary result)   Collection Time: 02/21/20  8:53 AM   Specimen: BLOOD   Result Value Ref Range Status   Specimen Description BLOOD LEFT AC  Final   Special Requests   Final    BOTTLES DRAWN AEROBIC AND ANAEROBIC Blood Culture results may not be optimal due to an excessive volume of blood received in culture bottles   Culture   Final    NO GROWTH < 24 HOURS Performed at [facility], [address] [place], [place] [address]    Report Status PENDING  Incomplete  Culture, blood (Routine X 2) w Reflex to ID Panel     Status: None (Preliminary result)   Collection Time: 02/21/20  8:53 AM   Specimen: BLOOD  Result Value Ref Range Status   Specimen Description BLOOD RIGHT [facility]  Final   Special Requests   Final    BOTTLES DRAWN AEROBIC AND ANAEROBIC Blood Culture adequate volume   Culture   Final    NO GROWTH < 24 HOURS Performed at [facility], [address] [place],  [address]    Report Status PENDING  Incomplete     Labs: BNP (last 3 results) No results for input(s): BNP in the last 8760 hours. Basic Metabolic Panel: Recent Labs  Lab 02/20/20 1856 02/21/20 0539 02/22/20 0453  NA 135 134* 139  K 3.4* 3.6 4.1  CL 103 102 109  CO2 23 25 23   GLUCOSE 121* 110* 101*  BUN 10 12 10   CREATININE 1.01 0.98 0.82  CALCIUM 9.0 8.7* 8.5*   Liver Function Tests: Recent Labs  Lab 02/20/20 1856  AST 21  ALT 61*  ALKPHOS 99  BILITOT 0.8  PROT 7.5  ALBUMIN 3.9   No results for input(s): LIPASE, AMYLASE in the last 168 hours. No results for input(s): AMMONIA in the last 168 hours. CBC: Recent Labs  Lab 02/20/20 1856 02/21/20 0539 02/22/20 0453  WBC 10.8* 8.4 6.5  NEUTROABS 6.5 4.9  --   HGB 14.4 14.3 13.8  HCT 42.0 39.3 39.0  MCV 87.3 86.8 87.2  PLT 269 233 241   Cardiac Enzymes: No results for input(s): CKTOTAL, CKMB, CKMBINDEX, TROPONINI in the last 168 hours. BNP: Invalid input(s): POCBNP CBG: No results for input(s): GLUCAP in the last 168 hours. D-Dimer No results for input(s): DDIMER in the last 72  hours. Hgb A1c No results for input(s): HGBA1C in the last 72 hours. Lipid Profile No results for input(s): CHOL, HDL, LDLCALC, TRIG, CHOLHDL, LDLDIRECT in the last 72 hours. Thyroid function studies No results for input(s): TSH, T4TOTAL, T3FREE, THYROIDAB in the last 72 hours.  Invalid input(s): FREET3 Anemia work up No results for input(s): VITAMINB12, FOLATE, FERRITIN, TIBC, IRON, RETICCTPCT in the last 72 hours. Urinalysis    Component Value Date/Time   COLORURINE YELLOW 04/01/2016 0327   APPEARANCEUR HAZY (A) 04/01/2016 0327  LABSPEC 1.019 04/01/2016 0327   PHURINE 6.0 04/01/2016 0327   GLUCOSEU NEGATIVE 04/01/2016 0327   HGBUR NEGATIVE 04/01/2016 0327   BILIRUBINUR NEGATIVE 04/01/2016 0327   KETONESUR NEGATIVE 04/01/2016 0327   PROTEINUR NEGATIVE 04/01/2016 0327   UROBILINOGEN 1.0 06/24/2007 1559   NITRITE NEGATIVE 04/01/2016 0327   LEUKOCYTESUR NEGATIVE 04/01/2016 0327   Sepsis Labs Invalid input(s): PROCALCITONIN,  WBC,  LACTICIDVEN Microbiology Recent Results (from the past 240 hour(s))  Resp Panel by RT-PCR (Flu A&B, Covid) Nasopharyngeal Swab     Status: None   Collection Time: 02/21/20  5:39 AM   Specimen: Nasopharyngeal Swab; Nasopharyngeal(NP) swabs in vial transport medium  Result Value Ref Range Status   SARS Coronavirus 2 by RT PCR NEGATIVE NEGATIVE Final    Comment: (NOTE) SARS-CoV-2 target nucleic acids are NOT DETECTED.  The SARS-CoV-2 RNA is generally detectable in upper respiratory specimens during the acute phase of infection. The lowest concentration of SARS-CoV-2 viral copies this assay can detect is 138 copies/mL. A negative result does not preclude SARS-Cov-2 infection and should not be used as the sole basis for treatment or other patient management decisions. A negative result may occur with  improper specimen collection/handling, submission of specimen other than nasopharyngeal swab, presence of viral mutation(s) within the areas targeted  by this assay, and inadequate number of viral copies(<138 copies/mL). A negative result must be combined with clinical observations, patient history, and epidemiological information. The expected result is Negative.  Fact Sheet for Patients:  EntrepreneurPulse.com.au  Fact Sheet for Healthcare Providers:  IncredibleEmployment.be  This test is no t yet approved or cleared by the [place] FDA and  has been authorized for detection and/or diagnosis of SARS-CoV-2 by FDA under an Emergency Use Authorization (EUA). This EUA will remain  in effect (meaning this test can be used) for the duration of the COVID-19 declaration under Section 564(b)(1) of the Act, 21 U.S.C.section 360bbb-3(b)(1), unless the authorization is terminated  or revoked sooner.       Influenza A by PCR NEGATIVE NEGATIVE Final   Influenza B by PCR NEGATIVE NEGATIVE Final    Comment: (NOTE) The Xpert Xpress SARS-CoV-2/FLU/RSV plus assay is intended as an aid in the diagnosis of influenza from Nasopharyngeal swab specimens and should not be used as a sole basis for treatment. Nasal washings and aspirates are unacceptable for Xpert Xpress SARS-CoV-2/FLU/RSV testing.  Fact Sheet for Patients: EntrepreneurPulse.com.au  Fact Sheet for Healthcare Providers: IncredibleEmployment.be  This test is not yet approved or cleared by the [place] FDA and has been authorized for detection and/or diagnosis of SARS-CoV-2 by FDA under an Emergency Use Authorization (EUA). This EUA will remain in effect (meaning this test can be used) for the duration of the COVID-19 declaration under Section 564(b)(1) of the Act, 21 U.S.C. section 360bbb-3(b)(1), unless the authorization is terminated or revoked.  Performed at [facility], [address] [place], [place] [address]   Culture, blood (Routine X 2) w Reflex to ID Panel     Status: None  (Preliminary result)   Collection Time: 02/21/20  8:53 AM   Specimen: BLOOD  Result Value Ref Range Status   Specimen Description BLOOD LEFT [facility]  Final   Special Requests   Final    BOTTLES DRAWN AEROBIC AND ANAEROBIC Blood Culture results may not be optimal due to an excessive volume of blood received in culture bottles   Culture   Final    NO GROWTH < 24 HOURS Performed at [facility], [address], [place], [place] [address]    Report Status PENDING  Incomplete  Culture, blood (Routine X 2) w Reflex to ID Panel     Status: None (Preliminary result)   Collection Time: 02/21/20  8:53 AM   Specimen: BLOOD  Result Value Ref Range Status   Specimen Description BLOOD RIGHT [facility]  Final   Special Requests   Final    BOTTLES DRAWN AEROBIC AND ANAEROBIC Blood Culture adequate volume   Culture   Final    NO GROWTH < 24 HOURS Performed at [facility], [address], [place], [place] [address]    Report Status PENDING  Incomplete    Please note: You were cared for by a hospitalist during your hospital stay. Once you are discharged, your primary care physician will handle any further medical issues. Please note that NO REFILLS for any discharge medications will be authorized once you are discharged, as it is imperative that you return to your primary care physician (or establish a relationship with a primary care physician if you do not have one) for your post hospital discharge needs so that they can reassess your need for medications and monitor your lab values.    Time coordinating discharge: 40 minutes  SIGNED:   Shelly Coss, MD  [facility] 02/22/2020, 11:26 AM Pager [phone]  If 7PM-7AM, please contact night-coverage [email or web address] Password [email or web address]

## 2020-02-22 NOTE — Plan of Care (Signed)

## 2020-02-22 NOTE — Plan of Care (Signed)
  Problem: Education: Goal: Knowledge of General Education information will improve Description: Including pain rating scale, medication(s)/side effects and non-pharmacologic comfort measures 02/22/2020 1227 by Shearon Balo, RN Outcome: Adequate for Discharge 02/22/2020 1139 by Shearon Balo, RN Outcome: Adequate for Discharge   Problem: Health Behavior/Discharge Planning: Goal: Ability to manage health-related needs will improve 02/22/2020 1227 by Shearon Balo, RN Outcome: Adequate for Discharge 02/22/2020 1139 by Shearon Balo, RN Outcome: Adequate for Discharge   Problem: Clinical Measurements: Goal: Ability to maintain clinical measurements within normal limits will improve 02/22/2020 1227 by Shearon Balo, RN Outcome: Adequate for Discharge 02/22/2020 1139 by Shearon Balo, RN Outcome: Adequate for Discharge Goal: Will remain free from infection 02/22/2020 1227 by Shearon Balo, RN Outcome: Adequate for Discharge 02/22/2020 1139 by Shearon Balo, RN Outcome: Adequate for Discharge Goal: Diagnostic test results will improve 02/22/2020 1227 by Shearon Balo, RN Outcome: Adequate for Discharge 02/22/2020 1139 by Shearon Balo, RN Outcome: Adequate for Discharge Goal: Respiratory complications will improve 02/22/2020 1227 by Shearon Balo, RN Outcome: Adequate for Discharge 02/22/2020 1139 by Shearon Balo, RN Outcome: Adequate for Discharge Goal: Cardiovascular complication will be avoided 02/22/2020 1227 by Shearon Balo, RN Outcome: Adequate for Discharge 02/22/2020 1139 by Shearon Balo, RN Outcome: Adequate for Discharge   Problem: Activity: Goal: Risk for activity intolerance will decrease 02/22/2020 1227 by Shearon Balo, RN Outcome: Adequate for Discharge 02/22/2020 1139 by Shearon Balo, RN Outcome: Adequate for Discharge   Problem: Nutrition: Goal: Adequate nutrition will be maintained 02/22/2020 1227 by Shearon Balo,  RN Outcome: Adequate for Discharge 02/22/2020 1139 by Shearon Balo, RN Outcome: Adequate for Discharge   Problem: Coping: Goal: Level of anxiety will decrease 02/22/2020 1227 by Shearon Balo, RN Outcome: Adequate for Discharge 02/22/2020 1139 by Shearon Balo, RN Outcome: Adequate for Discharge   Problem: Elimination: Goal: Will not experience complications related to bowel motility 02/22/2020 1227 by Shearon Balo, RN Outcome: Adequate for Discharge 02/22/2020 1139 by Shearon Balo, RN Outcome: Adequate for Discharge Goal: Will not experience complications related to urinary retention 02/22/2020 1227 by Shearon Balo, RN Outcome: Adequate for Discharge 02/22/2020 1139 by Shearon Balo, RN Outcome: Adequate for Discharge   Problem: Pain Managment: Goal: General experience of comfort will improve 02/22/2020 1227 by Shearon Balo, RN Outcome: Adequate for Discharge 02/22/2020 1139 by Shearon Balo, RN Outcome: Adequate for Discharge   Problem: Safety: Goal: Ability to remain free from injury will improve 02/22/2020 1227 by Shearon Balo, RN Outcome: Adequate for Discharge 02/22/2020 1139 by Shearon Balo, RN Outcome: Adequate for Discharge   Problem: Skin Integrity: Goal: Risk for impaired skin integrity will decrease 02/22/2020 1227 by Shearon Balo, RN Outcome: Adequate for Discharge 02/22/2020 1139 by Shearon Balo, RN Outcome: Adequate for Discharge

## 2020-02-22 NOTE — Progress Notes (Signed)
Subjective:  Patient reports pain as mild.    Objective:   VITALS:   Vitals:   02/21/20 1730 02/21/20 1948 02/21/20 2356 02/22/20 0425  BP: 100/60 130/76 114/67 113/67  Pulse: 83 71 62 (!) 57  Resp: 18 17 17    Temp:  97.8 F (36.6 C) 98 F (36.7 C) 97.7 F (36.5 C)  TempSrc:    Oral  SpO2: 99% 98% 98% 98%  Weight:      Height:        PHYSICAL EXAM:  Neurologically intact Neurovascular intact Sensation intact distally Intact pulses distally Dorsiflexion/Plantar flexion intact Incision: no drainage Compartment soft  LABS  Results for orders placed or performed during the hospital encounter of 02/21/20 (from the past 24 hour(s))  Sedimentation rate     Status: Abnormal   Collection Time: 02/21/20 10:15 AM  Result Value Ref Range   Sed Rate 17 (H) 0 - 15 mm/hr  C-reactive protein     Status: Abnormal   Collection Time: 02/21/20 10:15 AM  Result Value Ref Range   CRP 3.9 (H) <1.0 mg/dL  Protime-INR     Status: None   Collection Time: 02/21/20 10:15 AM  Result Value Ref Range   Prothrombin Time 13.2 11.4 - 15.2 seconds   INR 1.0 0.8 - 1.2  APTT     Status: None   Collection Time: 02/21/20 10:15 AM  Result Value Ref Range   aPTT 26 24 - 36 seconds  HIV Antibody (routine testing w rflx)     Status: None   Collection Time: 02/21/20 10:15 AM  Result Value Ref Range   HIV Screen 4th Generation wRfx Non Reactive Non Reactive  Basic metabolic panel     Status: Abnormal   Collection Time: 02/22/20  4:53 AM  Result Value Ref Range   Sodium 139 135 - 145 mmol/L   Potassium 4.1 3.5 - 5.1 mmol/L   Chloride 109 98 - 111 mmol/L   CO2 23 22 - 32 mmol/L   Glucose, Bld 101 (H) 70 - 99 mg/dL   BUN 10 6 - 20 mg/dL   Creatinine, Ser 0.82 0.61 - 1.24 mg/dL   Calcium 8.5 (L) 8.9 - 10.3 mg/dL   GFR, Estimated >60 >60 mL/min   Anion gap 7 5 - 15  CBC     Status: None   Collection Time: 02/22/20  4:53 AM  Result Value Ref Range   WBC 6.5 4.0 - 10.5 K/uL   RBC 4.47 4.22 -  5.81 MIL/uL   Hemoglobin 13.8 13.0 - 17.0 g/dL   HCT 39.0 39.0 - 52.0 %   MCV 87.2 80.0 - 100.0 fL   MCH 30.9 26.0 - 34.0 pg   MCHC 35.4 30.0 - 36.0 g/dL   RDW 12.1 11.5 - 15.5 %   Platelets 241 150 - 400 K/uL   nRBC 0.0 0.0 - 0.2 %    DG Finger Index Right  Result Date: 02/20/2020 CLINICAL DATA:  Swelling cut EXAM: RIGHT INDEX FINGER 2+V COMPARISON:  None. FINDINGS: No fracture or malalignment. Diffuse soft tissue swelling. No radiopaque foreign body or soft tissue emphysema. No periostitis or bone destruction IMPRESSION: Soft tissue swelling. No acute osseous abnormality. Electronically Signed   By: Donavan Foil M.D.   On: 02/20/2020 20:45    Assessment/Plan:     Principal Problem:   Cellulitis of right index finger Active Problems:   ADHD (attention deficit hyperactivity disorder)   Asthma   Advance diet  Continue IV  antibiotics   OK for discharge on oral antibiotics doxycycline and clindamycin.  We will see in the office on Friday at [facility] for repeat evaluation. Call to confirm [phone], [place] hand on pillows.  NPO Friday morning as a precaution.    Carlynn Spry , PA-C 02/22/2020, 6:48 AM

## 2020-02-28 LAB — CULTURE, BLOOD (ROUTINE X 2)
Culture: NO GROWTH
Culture: NO GROWTH
Special Requests: ADEQUATE

## 2020-10-02 ENCOUNTER — Encounter: Payer: Self-pay | Admitting: Emergency Medicine

## 2020-10-02 ENCOUNTER — Ambulatory Visit
Admission: EM | Admit: 2020-10-02 | Discharge: 2020-10-02 | Disposition: A | Discharge status: Home / Self Care | Payer: Commercial Managed Care - PPO | Attending: Nurse Practitioner | Admitting: Nurse Practitioner

## 2020-10-02 DIAGNOSIS — R110 Nausea: Secondary | ICD-10-CM

## 2020-10-02 DIAGNOSIS — K5289 Other specified noninfective gastroenteritis and colitis: Secondary | ICD-10-CM | POA: Diagnosis not present

## 2020-10-02 DIAGNOSIS — R1013 Epigastric pain: Secondary | ICD-10-CM | POA: Diagnosis not present

## 2020-10-02 MED ORDER — ONDANSETRON 4 MG PO TBDP
4.0000 mg | ORAL_TABLET | Freq: Once | ORAL | Status: AC
Start: 2020-10-02 — End: 2020-10-02
  Administered 2020-10-02: 4 mg via ORAL

## 2020-10-02 MED ORDER — DICYCLOMINE HCL 20 MG PO TABS: 20.0000 mg | ORAL_TABLET | Freq: Four times a day (QID) | ORAL | 0 refills | Status: DC | PRN

## 2020-10-02 MED ORDER — ONDANSETRON 8 MG PO TBDP: 8.0000 mg | ORAL_TABLET | Freq: Three times a day (TID) | ORAL | 0 refills | Status: AC | PRN

## 2020-10-02 NOTE — Discharge Instructions (Addendum)
  Drink lots of fluids to stay hydrated  You may eat if you feel like it  Stick to a very bland diet for the next few days  Avoid fried/greasy foods and dairy for the next few days as well  Take medications as needed for your symptoms

## 2020-10-02 NOTE — ED Provider Notes (Signed)
[facility]    CSN: [id number] Arrival date & time: 10/02/20  1358      History   Chief Complaint Chief Complaint  Patient presents with   Abdominal Pain    HPI Derrick Mayer is a 20 y.o. male.   Subjective:   Derrick Mayer is a 20 y.o. male who presents for evaluation of abdominal pain. The pain is described as cramping, and is 6/10 in intensity. Pain is located in the epigastric without radiation. Onset was several hours ago. Symptoms have been stable since. Aggravating factors: none.  Alleviating factors: none. Associated symptoms: diarrhea, chills, nausea, sweats, and dizziness. The patient denies dysuria, fever, headache or vomiting. Notably, patient reports that his wife made dinner last night for him and his brother. His brother is also having similar symptoms. Wife ate something different and does not have any symptoms.   The following portions of the patient's history were reviewed and updated as appropriate: allergies, current medications, past family history, past medical history, past social history, past surgical history, and problem list.   20 yo male presenting with acute epigastric pain, diarrhea, chills, nausea, sweats, and dizziness. Brother has similar symptoms.     Past Medical History:  Diagnosis Date   ADHD (attention deficit hyperactivity disorder)    Asthma    Attention deficit hyperactivity disorder (ADHD) 05/24/2015   Cancer ([place])    Cannabis abuse 05/20/2015   Collagen vascular disease ([place])    Depressive disorder 05/20/2015   History of ADHD 05/20/2015   Substance induced mood disorder ([place]) 05/20/2015    Patient Active Problem List   Diagnosis Date Noted   Cellulitis of right index finger 02/21/2020   Asthma 02/21/2020   Perirectal abscess 04/13/2018   Attention deficit hyperactivity disorder (ADHD) 05/24/2015   Cannabis abuse 05/20/2015   Depressive disorder 05/20/2015   Substance induced mood disorder ([place]) 05/20/2015   ADHD  (attention deficit hyperactivity disorder) 10/30/2012   Knee contusion 09/29/2012   Left knee sprain 09/13/2012   Abrasion of left leg 06/20/2012    Past Surgical History:  Procedure Laterality Date   INCISION AND DRAINAGE ABSCESS N/A 04/13/2018   Procedure: INCISION AND DRAINAGE ABSCESS;  Surgeon: Benjamine Sprague, DO;  Location: [facility] ORS;  Service: General;  Laterality: N/A;   WISDOM TOOTH EXTRACTION         Home Medications    Prior to Admission medications   Medication Sig Start Date End Date Taking? Authorizing Provider  dicyclomine (BENTYL) 20 MG tablet Take 1 tablet (20 mg total) by mouth 4 (four) times daily as needed (absominal cramping). 10/02/20  Yes Enrique Sack, FNP  ondansetron (ZOFRAN ODT) 8 MG disintegrating tablet Take 1 tablet (8 mg total) by mouth every 8 (eight) hours as needed for nausea or vomiting. 10/02/20  Yes Enrique Sack, FNP    Family History History reviewed. No pertinent family history.  Social History Social History   Tobacco Use   Smoking status: Never   Smokeless tobacco: Never  Substance Use Topics   Alcohol use: No   Drug use: Yes    Types: Marijuana     Allergies   Vancomycin   Review of Systems Review of Systems  Constitutional:  Positive for chills, diaphoresis and fatigue. Negative for fever.  Gastrointestinal:  Positive for abdominal pain, diarrhea and nausea. Negative for vomiting.  Genitourinary:  Negative for dysuria.  Musculoskeletal:  Negative for myalgias.  Neurological:  Positive for dizziness and headaches.    Physical Exam Triage Vital  Signs ED Triage Vitals [10/02/20 1430]  Enc Vitals Group     BP 138/79     Pulse Rate 63     Resp 16     Temp 97.8 F (36.6 C)     Temp Source Oral     SpO2 97 %     Weight      Height      Head Circumference      Peak Flow      Pain Score 6     Pain Loc      Pain Edu?      Excl. in [place]?    No data found.  Updated Vital Signs BP 138/79 (BP Location: Right Arm)    Pulse 63   Temp 97.8 F (36.6 C) (Oral)   Resp 16   SpO2 97%   Visual Acuity Right Eye Distance:   Left Eye Distance:   Bilateral Distance:    Right Eye Near:   Left Eye Near:    Bilateral Near:     Physical Exam Vitals reviewed.  Constitutional:      General: He is not in acute distress.    Appearance: He is well-developed. He is not ill-appearing, toxic-appearing or diaphoretic.  HENT:     Head: Normocephalic.  Eyes:     Extraocular Movements: Extraocular movements intact.  Cardiovascular:     Rate and Rhythm: Normal rate and regular rhythm.     Heart sounds: Normal heart sounds.  Pulmonary:     Effort: Pulmonary effort is normal.     Breath sounds: Normal breath sounds.  Abdominal:     General: Bowel sounds are normal. There is no distension.     Palpations: Abdomen is soft.     Tenderness: There is no abdominal tenderness.  Skin:    General: Skin is warm and dry.  Neurological:     General: No focal deficit present.  Psychiatric:        Mood and Affect: Mood normal.        Behavior: Behavior normal.     UC Treatments / Results  Labs (all labs ordered are listed, but only abnormal results are displayed) Labs Reviewed - No data to display  EKG   Radiology No results found.  Procedures Procedures (including critical care time)  Medications Ordered in UC Medications  ondansetron (ZOFRAN-ODT) disintegrating tablet 4 mg (4 mg Oral Given 10/02/20 1634)    Initial Impression / Assessment and Plan / UC Course  I have reviewed the triage vital signs and the nursing notes.  Pertinent labs & imaging results that were available during my care of the patient were reviewed by me and considered in my medical decision making (see chart for details).    They both ate the same thing for dinner last night. Patient is afebrile. AAOx3. Physical exam unremarkable. Symptoms most likely due to food borne illness/viral gastroenteritis.    The diagnosis was discussed  with the patient and evaluation and treatment plans outlined. Reassured patient that symptoms are almost certainly benign and self-resolving. Adhere to simple, bland diet. Follow up as needed.  Today's evaluation has revealed no signs of a dangerous process. Discussed diagnosis with patient and/or guardian. Patient and/or guardian aware of their diagnosis, possible red flag symptoms to watch out for and need for close follow up. Patient and/or guardian understands verbal and written discharge instructions. Patient and/or guardian comfortable with plan and disposition.  Patient and/or guardian has a clear mental status at this time,  good insight into illness (after discussion and teaching) and has clear judgment to make decisions regarding their care  This care was provided during an unprecedented National Emergency due to the Novel Coronavirus (COVID-19) pandemic. COVID-19 infections and transmission risks place heavy strains on healthcare resources.  As this pandemic evolves, our facility, providers, and staff strive to respond fluidly, to remain operational, and to provide care relative to available resources and information. Outcomes are unpredictable and treatments are without well-defined guidelines. Further, the impact of COVID-19 on all aspects of urgent care, including the impact to patients seeking care for reasons other than COVID-19, is unavoidable during this national emergency. At this time of the global pandemic, management of patients has significantly changed, even for non-COVID positive patients given high local and regional COVID volumes at this time requiring high healthcare system and resource utilization. The standard of care for management of both COVID suspected and non-COVID suspected patients continues to change rapidly at the local, regional, national, and global levels. This patient was worked up and treated to the best available but ever changing evidence and resources available at  this current time.   Documentation was completed with the aid of voice recognition software. Transcription may contain typographical errors. Final Clinical Impressions(s) / UC Diagnoses   Final diagnoses:  Other noninfectious gastroenteritis  Epigastric pain  Nausea without vomiting     Discharge Instructions       Drink lots of fluids to stay hydrated  You may eat if you feel like it  Stick to a very bland diet for the next few days  Avoid fried/greasy foods and dairy for the next few days as well  Take medications as needed for your symptoms        ED Prescriptions     Medication Sig Dispense Auth. Provider   dicyclomine (BENTYL) 20 MG tablet Take 1 tablet (20 mg total) by mouth 4 (four) times daily as needed (absominal cramping). 20 tablet Enrique Sack, FNP   ondansetron (ZOFRAN ODT) 8 MG disintegrating tablet Take 1 tablet (8 mg total) by mouth every 8 (eight) hours as needed for nausea or vomiting. 20 tablet Enrique Sack, FNP      PDMP not reviewed this encounter.   Enrique Sack, [place] 10/02/20 [phone]

## 2020-10-02 NOTE — ED Triage Notes (Signed)
At work, began having abdominal pain, sweating, chills, nausea, diarrhea. Denies vomiting. Friend also feeling the same, they both ate the same food for dinner last night. Pain is in the epigastric area.

## 2021-03-06 ENCOUNTER — Encounter: Payer: Self-pay | Admitting: Emergency Medicine

## 2021-03-06 ENCOUNTER — Other Ambulatory Visit: Payer: Self-pay

## 2021-03-06 ENCOUNTER — Emergency Department: Payer: Self-pay

## 2021-03-06 ENCOUNTER — Emergency Department
Admission: EM | Admit: 2021-03-06 | Discharge: 2021-03-06 | Disposition: A | Discharge status: Home / Self Care | Payer: Self-pay | Attending: Emergency Medicine | Admitting: Emergency Medicine

## 2021-03-06 DIAGNOSIS — Z20822 Contact with and (suspected) exposure to covid-19: Secondary | ICD-10-CM | POA: Insufficient documentation

## 2021-03-06 DIAGNOSIS — J209 Acute bronchitis, unspecified: Secondary | ICD-10-CM | POA: Insufficient documentation

## 2021-03-06 DIAGNOSIS — Z85828 Personal history of other malignant neoplasm of skin: Secondary | ICD-10-CM | POA: Insufficient documentation

## 2021-03-06 DIAGNOSIS — J45909 Unspecified asthma, uncomplicated: Secondary | ICD-10-CM | POA: Insufficient documentation

## 2021-03-06 LAB — RESP PANEL BY RT-PCR (FLU A&B, COVID) ARPGX2
Influenza A by PCR: NEGATIVE
Influenza B by PCR: NEGATIVE
SARS Coronavirus 2 by RT PCR: NEGATIVE

## 2021-03-06 MED ORDER — PREDNISONE 10 MG (21) PO TBPK: ORAL_TABLET | ORAL | 0 refills | Status: DC

## 2021-03-06 MED ORDER — ALBUTEROL SULFATE HFA 108 (90 BASE) MCG/ACT IN AERS: 2.0000 | INHALATION_SPRAY | Freq: Four times a day (QID) | RESPIRATORY_TRACT | 2 refills | Status: DC | PRN

## 2021-03-06 MED ORDER — AZITHROMYCIN 250 MG PO TABS: ORAL_TABLET | ORAL | 0 refills | Status: AC

## 2021-03-06 MED ORDER — IBUPROFEN 600 MG PO TABS
600.0000 mg | ORAL_TABLET | Freq: Once | ORAL | Status: AC
  Administered 2021-03-06: 13:00:00 600 mg via ORAL
  Filled 2021-03-06: qty 1

## 2021-03-06 MED ORDER — IPRATROPIUM-ALBUTEROL 0.5-2.5 (3) MG/3ML IN SOLN
3.0000 mL | Freq: Once | RESPIRATORY_TRACT | Status: AC
  Administered 2021-03-06: 13:00:00 3 mL via RESPIRATORY_TRACT
  Filled 2021-03-06: qty 3

## 2021-03-06 NOTE — ED Notes (Signed)
Patient discharged to home per MD order. Patient in stable condition, and deemed medically cleared by ED provider for discharge. Discharge instructions reviewed with patient/family using "Teach Back"; verbalized understanding of medication education and administration, and information about follow-up care. Denies further concerns. ° °

## 2021-03-06 NOTE — ED Provider Notes (Signed)
[facility] Emergency Department Provider Note  ____________________________________________   Event Date/Time   First MD Initiated Contact with Patient 03/06/21 1158     (approximate)  I have reviewed the triage vital signs and the nursing notes.   HISTORY  Chief Complaint Cough, Shortness of Breath, and Chills    HPI Derrick Mayer is a 20 y.o. male presents emergency department with a history of asthma complaining of cough and congestion with wheezing.  States he feels like it is all in his chest.  Some fever and chills and body aches.  Is coughing up white mucus.  No vomiting or diarrhea  Past Medical History:  Diagnosis Date   ADHD (attention deficit hyperactivity disorder)    Asthma    Attention deficit hyperactivity disorder (ADHD) 05/24/2015   Cancer ([place])    Cannabis abuse 05/20/2015   Collagen vascular disease ([place])    Depressive disorder 05/20/2015   History of ADHD 05/20/2015   Substance induced mood disorder ([place]) 05/20/2015    Patient Active Problem List   Diagnosis Date Noted   Cellulitis of right index finger 02/21/2020   Asthma 02/21/2020   Perirectal abscess 04/13/2018   Attention deficit hyperactivity disorder (ADHD) 05/24/2015   Cannabis abuse 05/20/2015   Depressive disorder 05/20/2015   Substance induced mood disorder ([place]) 05/20/2015   ADHD (attention deficit hyperactivity disorder) 10/30/2012   Knee contusion 09/29/2012   Left knee sprain 09/13/2012   Abrasion of left leg 06/20/2012    Past Surgical History:  Procedure Laterality Date   INCISION AND DRAINAGE ABSCESS N/A 04/13/2018   Procedure: INCISION AND DRAINAGE ABSCESS;  Surgeon: Benjamine Sprague, DO;  Location: [facility] ORS;  Service: General;  Laterality: N/A;   WISDOM TOOTH EXTRACTION      Prior to Admission medications   Medication Sig Start Date End Date Taking? Authorizing Provider  albuterol (VENTOLIN HFA) 108 (90 Base) MCG/ACT inhaler Inhale 2 puffs into the lungs  every 6 (six) hours as needed for wheezing or shortness of breath. 03/06/21  Yes Maki Hege, Linden Dolin, PA-C  azithromycin (ZITHROMAX Z-PAK) 250 MG tablet Take 2 tablets (500 mg) on  Day 1,  followed by 1 tablet (250 mg) once daily on Days 2 through 5. 03/06/21 03/11/21 Yes Lanaya Bennis, Linden Dolin, PA-C  predniSONE (STERAPRED UNI-PAK 21 TAB) 10 MG (21) TBPK tablet Take 6 pills on day one then decrease by 1 pill each day 03/06/21  Yes Shizue Kaseman, Linden Dolin, PA-C  dicyclomine (BENTYL) 20 MG tablet Take 1 tablet (20 mg total) by mouth 4 (four) times daily as needed (absominal cramping). 10/02/20   Enrique Sack, FNP  ondansetron (ZOFRAN ODT) 8 MG disintegrating tablet Take 1 tablet (8 mg total) by mouth every 8 (eight) hours as needed for nausea or vomiting. 10/02/20   Enrique Sack, FNP    Allergies Vancomycin  History reviewed. No pertinent family history.  Social History Social History   Tobacco Use   Smoking status: Never   Smokeless tobacco: Never  Substance Use Topics   Alcohol use: No   Drug use: Yes    Types: Marijuana    Review of Systems  Constitutional: Positive fever/chills Eyes: No visual changes. ENT: No sore throat. Respiratory: Positive cough Cardiovascular: Denies chest pain Gastrointestinal: Denies abdominal pain Genitourinary: Negative for dysuria. Musculoskeletal: Negative for back pain. Skin: Negative for rash. Psychiatric: no mood changes,     ____________________________________________   PHYSICAL EXAM:  VITAL SIGNS: ED Triage Vitals  Enc Vitals Group  BP 03/06/21 1108 117/71     Pulse Rate 03/06/21 1108 86     Resp 03/06/21 1108 20     Temp 03/06/21 1108 98.3 F (36.8 C)     Temp Source 03/06/21 1108 Oral     SpO2 03/06/21 1108 97 %     Weight 03/06/21 1107 195 lb (88.5 kg)     Height 03/06/21 1107 5\' 7"  (1.702 m)     Head Circumference --      Peak Flow --      Pain Score 03/06/21 1107 8     Pain Loc --      Pain Edu? --      Excl. in [place]? --      Constitutional: Alert and oriented. Well appearing and in no acute distress. Eyes: Conjunctivae are normal.  Head: Atraumatic. Nose: No congestion/rhinnorhea. Mouth/Throat: Mucous membranes are moist.   Neck:  supple no lymphadenopathy noted Cardiovascular: Normal rate, regular rhythm. Heart sounds are normal Respiratory: Normal respiratory effort.  No retractions, lungs with wheezing noted throughout all lung fields GU: deferred Musculoskeletal: FROM all extremities, warm and well perfused Neurologic:  Normal speech and language.  Skin:  Skin is warm, dry and intact. No rash noted. Psychiatric: Mood and affect are normal. Speech and behavior are normal.  ____________________________________________   LABS (all labs ordered are listed, but only abnormal results are displayed)  Labs Reviewed  RESP PANEL BY RT-PCR (FLU A&B, COVID) ARPGX2   ____________________________________________   ____________________________________________  RADIOLOGY  Chest x-ray  ____________________________________________   PROCEDURES  Procedure(s) performed: DuoNeb   Procedures    ____________________________________________   INITIAL IMPRESSION / ASSESSMENT AND PLAN / ED COURSE  Pertinent labs & imaging results that were available during my care of the patient were reviewed by me and considered in my medical decision making (see chart for details).   The patient is a 20 year old male presents emergency department with URI symptoms.  Also having wheezing.  See HPI.  Physical exam shows patient be stable.  Lungs with diffuse wheezing bilaterally.  Respiratory panel obtained, chest x-ray ordered, DuoNeb ordered  Respiratory panel is negative for COVID and influenza, Chest x-ray reviewed by me confirmed by radiology to be negative for pneumonia DuoNeb was given.  Patient on recheck had clear lungs with no wheezing.  Did discuss these findings with patient.  We did discuss smoking  cessation.  He was given a prescription for Z-Pak, Sterapred, and albuterol inhaler.  He is to follow-up with his regular doctor if not improved in 3 days.  Return emergency department worsening.  Patient is in agreement treatment plan.  He was discharged stable condition.     Derrick Mayer was evaluated in Emergency Department on 03/06/2021 for the symptoms described in the history of present illness. He was evaluated in the context of the global COVID-19 pandemic, which necessitated consideration that the patient might be at risk for infection with the SARS-CoV-2 virus that causes COVID-19. Institutional protocols and algorithms that pertain to the evaluation of patients at risk for COVID-19 are in a state of rapid change based on information released by regulatory bodies including the CDC and federal and state organizations. These policies and algorithms were followed during the patient's care in the ED.    As part of my medical decision making, I reviewed the following data within the [place] History obtained from family, Nursing notes reviewed and incorporated, Labs reviewed , Old chart reviewed, Radiograph reviewed , Notes from  prior ED visits, and [place] Controlled Substance Database  ____________________________________________   FINAL CLINICAL IMPRESSION(S) / ED DIAGNOSES  Final diagnoses:  Acute bronchitis, unspecified organism      NEW MEDICATIONS STARTED DURING THIS VISIT:  New Prescriptions   ALBUTEROL (VENTOLIN HFA) 108 (90 BASE) MCG/ACT INHALER    Inhale 2 puffs into the lungs every 6 (six) hours as needed for wheezing or shortness of breath.   AZITHROMYCIN (ZITHROMAX Z-PAK) 250 MG TABLET    Take 2 tablets (500 mg) on  Day 1,  followed by 1 tablet (250 mg) once daily on Days 2 through 5.   PREDNISONE (STERAPRED UNI-PAK 21 TAB) 10 MG (21) TBPK TABLET    Take 6 pills on day one then decrease by 1 pill each day     Note:  This document was prepared using Dragon  voice recognition software and may include unintentional dictation errors.    Versie Starks, PA-C 03/06/21 1319    Rada Hay, MD 03/06/21 [phone]

## 2021-03-06 NOTE — Discharge Instructions (Signed)
See regular doctor if not improving in 2 to 3 days.  Return emergency department if worsening.  Use medications as prescribed. You may also use over-the-counter Delsym or Robitussin for cough Use the inhaler if you feel yourself wheezing or feel short of breath It is very important that you quit smoking.  We will continue to have worsening bronchitis if you smoke.  You may also develop lung cancer.

## 2021-03-06 NOTE — ED Triage Notes (Signed)
Pt comes into the ED via POV c/o cough, SHOB, chills x 3 days.  Pt states when he coughs, white mucous comes up.  Pt in NAD at this time with even and unlabored respirations.

## 2021-03-26 ENCOUNTER — Emergency Department: Payer: Self-pay

## 2021-03-26 ENCOUNTER — Emergency Department
Admission: EM | Admit: 2021-03-26 | Discharge: 2021-03-26 | Disposition: A | Discharge status: Home / Self Care | Payer: Self-pay | Attending: Emergency Medicine | Admitting: Emergency Medicine

## 2021-03-26 ENCOUNTER — Encounter: Payer: Self-pay | Admitting: Emergency Medicine

## 2021-03-26 ENCOUNTER — Other Ambulatory Visit: Payer: Self-pay

## 2021-03-26 DIAGNOSIS — J45909 Unspecified asthma, uncomplicated: Secondary | ICD-10-CM | POA: Insufficient documentation

## 2021-03-26 DIAGNOSIS — U071 COVID-19: Secondary | ICD-10-CM | POA: Insufficient documentation

## 2021-03-26 DIAGNOSIS — R0602 Shortness of breath: Secondary | ICD-10-CM

## 2021-03-26 DIAGNOSIS — J209 Acute bronchitis, unspecified: Secondary | ICD-10-CM

## 2021-03-26 DIAGNOSIS — R071 Chest pain on breathing: Secondary | ICD-10-CM | POA: Insufficient documentation

## 2021-03-26 LAB — BASIC METABOLIC PANEL
Anion gap: 7 (ref 5–15)
BUN: 11 mg/dL (ref 6–20)
CO2: 25 mmol/L (ref 22–32)
Calcium: 9.1 mg/dL (ref 8.9–10.3)
Chloride: 104 mmol/L (ref 98–111)
Creatinine, Ser: 0.82 mg/dL (ref 0.61–1.24)
GFR, Estimated: >60 mL/min (ref >60)
Glucose, Bld: 104 mg/dL — ABNORMAL HIGH (ref 70–99)
Potassium: 4 mmol/L (ref 3.5–5.1)
Sodium: 136 mmol/L (ref 135–145)

## 2021-03-26 LAB — CBC WITH DIFFERENTIAL/PLATELET
Abs Immature Granulocytes: 0.02 10*3/uL (ref 0.00–0.07)
Basophils Absolute: 0.1 10*3/uL (ref 0.0–0.1)
Basophils Relative: 1 %
Eosinophils Absolute: 0.6 10*3/uL — ABNORMAL HIGH (ref 0.0–0.5)
Eosinophils Relative: 8 %
HCT: 41.2 % (ref 39.0–52.0)
Hemoglobin: 14.6 g/dL (ref 13.0–17.0)
Immature Granulocytes: 0 %
Lymphocytes Relative: 54 %
Lymphs Abs: 3.5 10*3/uL (ref 0.7–4.0)
MCH: 31 pg (ref 26.0–34.0)
MCHC: 35.4 g/dL (ref 30.0–36.0)
MCV: 87.5 fL (ref 80.0–100.0)
Monocytes Absolute: 0.4 10*3/uL (ref 0.1–1.0)
Monocytes Relative: 6 %
Neutro Abs: 2.1 10*3/uL (ref 1.7–7.7)
Neutrophils Relative %: 31 %
Platelets: 223 10*3/uL (ref 150–400)
RBC: 4.71 MIL/uL (ref 4.22–5.81)
RDW: 13.1 % (ref 11.5–15.5)
WBC: 6.7 10*3/uL (ref 4.0–10.5)
nRBC: 0 % (ref 0.0–0.2)

## 2021-03-26 LAB — RESP PANEL BY RT-PCR (FLU A&B, COVID) ARPGX2
Influenza A by PCR: NEGATIVE
Influenza B by PCR: NEGATIVE
SARS Coronavirus 2 by RT PCR: POSITIVE — AB

## 2021-03-26 LAB — TROPONIN I (HIGH SENSITIVITY)
Troponin I (High Sensitivity): <2 ng/L (ref <18)
Troponin I (High Sensitivity): <2 ng/L (ref <18)

## 2021-03-26 MED ORDER — IOHEXOL 350 MG/ML SOLN
75.0000 mL | Freq: Once | INTRAVENOUS | Status: AC | PRN
  Administered 2021-03-26: 75 mL via INTRAVENOUS

## 2021-03-26 MED ORDER — IPRATROPIUM-ALBUTEROL 0.5-2.5 (3) MG/3ML IN SOLN
3.0000 mL | Freq: Once | RESPIRATORY_TRACT | Status: AC
  Administered 2021-03-26: 3 mL via RESPIRATORY_TRACT
  Filled 2021-03-26: qty 3

## 2021-03-26 MED ORDER — PREDNISONE 20 MG PO TABS
60.0000 mg | ORAL_TABLET | Freq: Once | ORAL | Status: AC
Start: 2021-03-26 — End: 2021-03-26
  Administered 2021-03-26: 60 mg via ORAL
  Filled 2021-03-26: qty 3

## 2021-03-26 MED ORDER — ALBUTEROL SULFATE HFA 108 (90 BASE) MCG/ACT IN AERS: 2.0000 | INHALATION_SPRAY | Freq: Four times a day (QID) | RESPIRATORY_TRACT | 0 refills | Status: DC | PRN

## 2021-03-26 MED ORDER — PREDNISONE 20 MG PO TABS
60.0000 mg | ORAL_TABLET | Freq: Every day | ORAL | 0 refills | Status: AC
Start: 2021-03-26 — End: 2021-03-31

## 2021-03-26 NOTE — ED Provider Notes (Signed)
[facility] Provider Note    Event Date/Time   First MD Initiated Contact with Patient 03/26/21 [phone]     (approximate)   History   Chief Complaint Cough   HPI  Derrick Mayer is a 21 y.o. male with past medical history of asthma who presents to the ED complaining of cough and shortness of breath.  Patient reports that he has been dealing with increasing cough productive of thick yellowish sputum for about the past 2 weeks.  He was initially seen in the ED for the symptoms 2 weeks ago, diagnosed with bronchitis and started on azithromycin, steroids, and albuterol as needed.  He states that the inhaler initially helped with his symptoms but is no longer helping as much.  He has been feeling increasingly short of breath with pain in his chest.  He describes the pain as sharp and worse when he takes a deep breath.  He denies any fevers and has not noticed any pain or swelling in his legs.  He admits to continuing to smoke marijuana on a daily basis, denies other drug use and does not smoke cigarettes.     Physical Exam   Triage Vital Signs: ED Triage Vitals  Enc Vitals Group     BP 03/26/21 0526 139/80     Pulse Rate 03/26/21 0526 82     Resp 03/26/21 0526 20     Temp 03/26/21 0526 98.8 F (37.1 C)     Temp src --      SpO2 03/26/21 0526 94 %     Weight 03/26/21 0525 190 lb (86.2 kg)     Height 03/26/21 0525 5\' 7"  (1.702 m)     Head Circumference --      Peak Flow --      Pain Score 03/26/21 0525 8     Pain Loc --      Pain Edu? --      Excl. in [place]? --     Most recent vital signs: Vitals:   03/26/21 0526  BP: 139/80  Pulse: 82  Resp: 20  Temp: 98.8 F (37.1 C)  SpO2: 94%    Constitutional: Alert and oriented. Eyes: Conjunctivae are normal. Head: Atraumatic. Nose: No congestion/rhinnorhea. Mouth/Throat: Mucous membranes are moist.  Cardiovascular: Normal rate, regular rhythm. Grossly normal heart sounds.  2+ radial pulses  bilaterally. Respiratory: Normal respiratory effort.  No retractions. Lungs with faint end expiratory wheezing. Gastrointestinal: Soft and nontender. No distention. Musculoskeletal: No lower extremity tenderness nor edema.  Neurologic:  Normal speech and language. No gross focal neurologic deficits are appreciated.    ED Results / Procedures / Treatments   Labs (all labs ordered are listed, but only abnormal results are displayed) Labs Reviewed  CBC WITH DIFFERENTIAL/PLATELET - Abnormal; Notable for the following components:      Result Value   Eosinophils Absolute 0.6 (*)    All other components within normal limits  RESP PANEL BY RT-PCR (FLU A&B, COVID) ARPGX2  BASIC METABOLIC PANEL  TROPONIN I (HIGH SENSITIVITY)     EKG  ED ECG REPORT I, Blake Divine, the attending physician, personally viewed and interpreted this ECG.   Date: 03/26/2021  EKG Time: 6:42  Rate: 69  Rhythm: normal sinus rhythm  Axis: Normal  Intervals:none  ST&T Change: None  RADIOLOGY Chest x-ray reviewed by me with no infiltrate, edema, or effusion.  PROCEDURES:  Critical Care performed: No  Procedures   MEDICATIONS ORDERED IN ED: Medications  predniSONE (DELTASONE)  tablet 60 mg (60 mg Oral Given 03/26/21 0626)  ipratropium-albuterol (DUONEB) 0.5-2.5 (3) MG/3ML nebulizer solution 3 mL (3 mLs Nebulization Given 03/26/21 4782)     IMPRESSION / MDM / ASSESSMENT AND PLAN / ED COURSE  I reviewed the triage vital signs and the nursing notes.                              21 y.o. male with past medical history of asthma who presents to the ED with increasing cough, chest pain, difficulty breathing over the past 2 weeks.  Differential diagnosis includes, but is not limited to, asthma exacerbation, ACS, PE, pneumonia, bronchitis, pneumothorax.  Patient is nontoxic-appearing and in no acute distress, vital signs are reassuring and he is maintaining O2 sats on room air.  He does have faint  wheezing and symptoms could be related to exacerbation, however symptoms seem to be out of of proportion to lung findings.  We will trial DuoNeb and give dose of prednisone but also assess for ACS and PE with CTA of chest.  EKG shows no evidence of arrhythmia or ischemia, troponin is pending.  On reassessment following breathing treatment, patient reports that he feels better than before but continues to have pain in his chest and difficulty breathing.  CTA of chest is pending and patient turned over to oncoming provider pending CTA and lab results.  If these are unremarkable, patient will be appropriate for outpatient management with steroids and albuterol.       FINAL CLINICAL IMPRESSION(S) / ED DIAGNOSES   Final diagnoses:  Shortness of breath     Rx / DC Orders   ED Discharge Orders     None        Note:  This document was prepared using Dragon voice recognition software and may include unintentional dictation errors.   Blake Divine, MD 03/26/21 [phone]

## 2021-03-26 NOTE — ED Provider Notes (Signed)
Procedures     ----------------------------------------- 8:51 AM on 03/26/2021 ----------------------------------------- CT chest neg for PE or other significant finding. Covid test is positive, likely causing bronchitis and small area of pneumonitis on CT. Stable for DC on prednisone and albuterol.     Carrie Mew, MD 03/26/21 [phone]

## 2021-03-26 NOTE — Discharge Instructions (Addendum)
Your Covid test was positive, which is likely the cause of your symptoms.  Your other lab tests and CT scan of the chest were all okay.  Continue taking prednisone and albuterol at home as directed to help control your symptoms.

## 2021-03-26 NOTE — ED Triage Notes (Signed)
Patient ambulatory to triage with steady gait, without difficulty or distress noted; pt reports dx with bronchitis 3wks ago; c/o persistent prod cough, HA and "lung pain"

## 2022-03-07 ENCOUNTER — Encounter (HOSPITAL_COMMUNITY): Payer: Self-pay

## 2022-03-07 ENCOUNTER — Emergency Department (HOSPITAL_COMMUNITY)
Admission: EM | Admit: 2022-03-07 | Discharge: 2022-03-07 | Disposition: A | Discharge status: Home / Self Care | Payer: Self-pay | Attending: Emergency Medicine | Admitting: Emergency Medicine

## 2022-03-07 DIAGNOSIS — T5291XA Toxic effect of unspecified organic solvent, accidental (unintentional), initial encounter: Secondary | ICD-10-CM | POA: Insufficient documentation

## 2022-03-07 DIAGNOSIS — H10212 Acute toxic conjunctivitis, left eye: Secondary | ICD-10-CM | POA: Insufficient documentation

## 2022-03-07 MED ORDER — SODIUM CHLORIDE 0.9 % IV BOLUS: 1000.0000 mL | Freq: Once | INTRAVENOUS | Status: DC

## 2022-03-07 MED ORDER — ARTIFICIAL TEARS OPHTHALMIC OINT: TOPICAL_OINTMENT | OPHTHALMIC | 0 refills | Status: AC

## 2022-03-07 MED ORDER — TETRACAINE HCL 0.5 % OP SOLN
1.0000 [drp] | Freq: Once | OPHTHALMIC | Status: AC
  Administered 2022-03-07: 1 [drp] via OPHTHALMIC
  Filled 2022-03-07: qty 4

## 2022-03-07 NOTE — ED Notes (Signed)
Saline running to flush pts L eye w/[employer]

## 2022-03-07 NOTE — ED Triage Notes (Signed)
Pt presents with c/o chemicals in his eyes. Pt accidentally had seam cleaner come into contact with both of his eyes. Pt did attempt to flush them out with water and milk. Eyes are very red.

## 2022-03-07 NOTE — ED Provider Notes (Incomplete)
  [facility] DEPT Provider Note   CSN: [id number] Arrival date & time: 03/07/22  1804     History {Add pertinent medical, surgical, social history, OB history to HPI:1} Chief Complaint  Patient presents with   Chemical in eye    Derrick Mayer is a 21 y.o. male.6-8 oz of cleaner L eye  30 min ago +  Flushed with 1 quart of milk and 8 oz water.  [employer] TPO seam cleaner   HPI     Home Medications Prior to Admission medications   Medication Sig Start Date End Date Taking? Authorizing Provider  albuterol (VENTOLIN HFA) 108 (90 Base) MCG/ACT inhaler Inhale 2 puffs into the lungs every 6 (six) hours as needed for wheezing or shortness of breath. 03/26/21   Blake Divine, MD  dicyclomine (BENTYL) 20 MG tablet Take 1 tablet (20 mg total) by mouth 4 (four) times daily as needed (absominal cramping). Patient not taking: Reported on 03/26/2021 10/02/20   Enrique Sack, FNP  ondansetron (ZOFRAN ODT) 8 MG disintegrating tablet Take 1 tablet (8 mg total) by mouth every 8 (eight) hours as needed for nausea or vomiting. Patient not taking: Reported on 03/26/2021 10/02/20   Enrique Sack, FNP      Allergies    Vancomycin    Review of Systems   Review of Systems  Physical Exam Updated Vital Signs BP (!) 161/101 (BP Location: Left Arm)   Pulse 88   Temp 98.3 F (36.8 C) (Oral)   Resp 16   SpO2 100%  Physical Exam  ED Results / Procedures / Treatments   Labs (all labs ordered are listed, but only abnormal results are displayed) Labs Reviewed - No data to display  EKG None  Radiology No results found.  Procedures Procedures  {Document cardiac monitor, telemetry assessment procedure when appropriate:1}  Medications Ordered in ED Medications - No data to display  ED Course/ Medical Decision Making/ A&P                           Medical Decision Making  ***  {Document critical care time when appropriate:1} {Document review of  labs and clinical decision tools ie heart score, Chads2Vasc2 etc:1}  {Document your independent review of radiology images, and any outside records:1} {Document your discussion with family members, caretakers, and with consultants:1} {Document social determinants of health affecting pt's care:1} {Document your decision making why or why not admission, treatments were needed:1} Final Clinical Impression(s) / ED Diagnoses Final diagnoses:  None    Rx / DC Orders ED Discharge Orders     None

## 2022-03-07 NOTE — Discharge Instructions (Signed)
Contact a health care provider if: Your symptoms do not improve or they get worse. You have new symptoms. Your pain gets worse. You have pus draining from your eye. Get help right away if: Your vision suddenly gets worse.

## 2022-03-07 NOTE — ED Notes (Signed)
20/200 with L eye and 20/50 with R eye

## 2023-02-24 ENCOUNTER — Ambulatory Visit
Admission: RE | Admit: 2023-02-24 | Discharge: 2023-02-24 | Disposition: A | Discharge status: Home / Self Care | Payer: Self-pay | Source: Ambulatory Visit | Attending: Internal Medicine | Admitting: Internal Medicine

## 2023-02-24 ENCOUNTER — Ambulatory Visit: Payer: Self-pay

## 2023-02-24 VITALS — BP 124/73 | HR 79 | Temp 100.2°F

## 2023-02-24 DIAGNOSIS — J45901 Unspecified asthma with (acute) exacerbation: Secondary | ICD-10-CM

## 2023-02-24 DIAGNOSIS — R062 Wheezing: Secondary | ICD-10-CM

## 2023-02-24 LAB — POC COVID19/FLU A&B COMBO
Covid Antigen, POC: NEGATIVE
Influenza A Antigen, POC: NEGATIVE
Influenza B Antigen, POC: NEGATIVE

## 2023-02-24 MED ORDER — ALBUTEROL SULFATE (2.5 MG/3ML) 0.083% IN NEBU
2.5000 mg | INHALATION_SOLUTION | Freq: Four times a day (QID) | RESPIRATORY_TRACT | 3 refills | Status: AC | PRN
Start: 2023-02-24 — End: ?

## 2023-02-24 MED ORDER — DOXYCYCLINE HYCLATE 100 MG PO CAPS: 100.0000 mg | ORAL_CAPSULE | Freq: Two times a day (BID) | ORAL | 0 refills | Status: AC

## 2023-02-24 MED ORDER — IPRATROPIUM-ALBUTEROL 0.5-2.5 (3) MG/3ML IN SOLN
3.0000 mL | Freq: Once | RESPIRATORY_TRACT | Status: AC
  Administered 2023-02-24: 3 mL via RESPIRATORY_TRACT

## 2023-02-24 MED ORDER — BENZONATATE 200 MG PO CAPS
200.0000 mg | ORAL_CAPSULE | Freq: Three times a day (TID) | ORAL | 0 refills | Status: AC | PRN
Start: 2023-02-24 — End: ?

## 2023-02-24 MED ORDER — ALBUTEROL SULFATE HFA 108 (90 BASE) MCG/ACT IN AERS
2.0000 | INHALATION_SPRAY | Freq: Four times a day (QID) | RESPIRATORY_TRACT | 3 refills | Status: AC | PRN
Start: 2023-02-24 — End: ?

## 2023-02-24 MED ORDER — ACETAMINOPHEN 325 MG PO TABS
650.0000 mg | ORAL_TABLET | Freq: Once | ORAL | Status: AC
  Administered 2023-02-24: 650 mg via ORAL

## 2023-02-24 MED ORDER — METHYLPREDNISOLONE ACETATE 80 MG/ML IJ SUSP
40.0000 mg | Freq: Once | INTRAMUSCULAR | Status: AC
Start: 2023-02-24 — End: 2023-02-24
  Administered 2023-02-24: 40 mg via INTRAMUSCULAR

## 2023-02-24 NOTE — Discharge Instructions (Signed)
Your flu test was negative and your COVID was negative.  You have been sent several prescriptions to your pharmacy.  The first prescription I sent was for an albuterol inhaler.  You can use this when you feel short of breath or have wheezing.  This acts as a rescue inhaler for any asthma attacks that you may have.  I have also sent albuterol solution for you to use your new nebulizer machine.  Finally, I sent doxycycline to your pharmacy.  This is a antibiotic used to fight infections.  I also sent a cough medicine as well.  You were given an injection (methylprednisolone) for your asthma today. This will help with the wheezing.  I recommend you follow-up with your PCP for further maintenance and monitoring.  I have placed a referral for you to have PCP assistance to help find one.   Return in 2 to 3 days if no improvement. It is very important for you to pay attention to any new symptoms or worsening of your current condition. Please go directly to the Emergency Department immediately should you begin to have any of the following symptoms: shortness of breath, chest pain or difficulty breathing.

## 2023-02-24 NOTE — ED Provider Notes (Addendum)
[facility]  Note:  This document was prepared using Dragon voice recognition software and may include unintentional dictation errors.  MRN: [id number] DOB: 2000/06/29 DATE: 02/24/23   Subjective:  Chief Complaint:  Chief Complaint  Patient presents with   Cough    I have cough congestion and real difficulty breathing chest feels tight and I'm dizzy - Entered by patient     HPI: Derrick Mayer is a 22 y.o. male presenting for wheezing and cough for the past 2 days. Patient reports productive cough and shortness of breath for the past 2 days. No known sick contacts. He reports history of asthma, but has not had an inhaler for sometime. Patient also reports fevers. Temperature at triage was 100.2. He reports bathing in alcohol and taking Theraflu with no improvement. Denies nausea/vomiting, abdominal pain, sore throat, otalgia. Endorses cough, congestion, wheezing. Presents NAD.  Prior to Admission medications   Medication Sig Start Date End Date Taking? Authorizing Provider  albuterol (VENTOLIN HFA) 108 (90 Base) MCG/ACT inhaler Inhale 2 puffs into the lungs every 6 (six) hours as needed for wheezing or shortness of breath. 03/26/21   Chesley Noon, MD  ondansetron (ZOFRAN ODT) 8 MG disintegrating tablet Take 1 tablet (8 mg total) by mouth every 8 (eight) hours as needed for nausea or vomiting. Patient not taking: Reported on 03/26/2021 10/02/20   Lurline Idol, FNP     Allergies  Allergen Reactions   Vancomycin     Itches     History:   Past Medical History:  Diagnosis Date   ADHD (attention deficit hyperactivity disorder)    Asthma    Attention deficit hyperactivity disorder (ADHD) 05/24/2015   Cancer (HCC)    Cannabis abuse 05/20/2015   Collagen vascular disease (HCC)    Depressive disorder 05/20/2015   History of ADHD 05/20/2015   Substance induced mood disorder (HCC) 05/20/2015     Past Surgical History:  Procedure Laterality Date   INCISION AND DRAINAGE ABSCESS  N/A 04/13/2018   Procedure: INCISION AND DRAINAGE ABSCESS;  Surgeon: Sung Amabile, DO;  Location: [facility] ORS;  Service: General;  Laterality: N/A;   WISDOM TOOTH EXTRACTION      History reviewed. No pertinent family history.  Social History   Tobacco Use   Smoking status: Never   Smokeless tobacco: Never  Vaping Use   Vaping status: Never Used  Substance Use Topics   Alcohol use: No   Drug use: Yes    Types: Marijuana    Review of Systems  Constitutional:  Positive for fever.  HENT:  Positive for congestion and rhinorrhea. Negative for ear pain and sore throat.   Respiratory:  Positive for cough, shortness of breath and wheezing.   Gastrointestinal:  Negative for abdominal pain, nausea and vomiting.  Neurological:  Positive for dizziness and headaches.     Objective:   Vitals: BP 124/73 (BP Location: Right Arm)   Pulse 79   Temp 100.2 F (37.9 C) (Oral)   SpO2 98%   Physical Exam Constitutional:      General: He is not in acute distress.    Appearance: Normal appearance. He is well-developed. He is obese. He is not ill-appearing or toxic-appearing.  HENT:     Head: Normocephalic and atraumatic.     Right Ear: Ear canal normal. A middle ear effusion is present.     Left Ear: Ear canal normal. A middle ear effusion is present.     Mouth/Throat:     Pharynx: Oropharynx is  clear. Uvula midline. No pharyngeal swelling, oropharyngeal exudate or posterior oropharyngeal erythema.     Tonsils: No tonsillar exudate or tonsillar abscesses.  Cardiovascular:     Rate and Rhythm: Normal rate and regular rhythm.     Heart sounds: Normal heart sounds.  Pulmonary:     Effort: Pulmonary effort is normal.     Breath sounds: Decreased air movement present. Examination of the right-middle field reveals wheezing. Examination of the left-middle field reveals wheezing. Examination of the right-lower field reveals wheezing. Examination of the left-lower field reveals wheezing. Wheezing  present.     Comments: Of note, RR was 20 on triage. RR improved to 16 with neb treatment. Wheezing resolved with nebulizer treatment. Abdominal:     General: Bowel sounds are normal.     Palpations: Abdomen is soft.     Tenderness: There is no abdominal tenderness.  Skin:    General: Skin is warm and dry.  Neurological:     General: No focal deficit present.     Mental Status: He is alert.  Psychiatric:        Mood and Affect: Mood and affect normal.     Results:  Labs: Results for orders placed or performed during the hospital encounter of 02/24/23 (from the past 24 hours)  POC Covid + Flu A/B Antigen     Status: None   Collection Time: 02/24/23 11:43 AM  Result Value Ref Range   Influenza A Antigen, POC Negative Negative   Influenza B Antigen, POC Negative Negative   Covid Antigen, POC Negative Negative    Radiology: No results found.   UC Course/Treatments:  Procedures: Procedures   Medications Ordered in UC: Medications  methylPREDNISolone acetate (DEPO-MEDROL) injection 40 mg (has no administration in time range)  ipratropium-albuterol (DUONEB) 0.5-2.5 (3) MG/3ML nebulizer solution 3 mL (3 mLs Nebulization Given 02/24/23 1139)  acetaminophen (TYLENOL) tablet 650 mg (650 mg Oral Given 02/24/23 1139)     Assessment and Plan :     ICD-10-CM   1. Asthma with acute exacerbation, unspecified asthma severity, unspecified whether persistent  J45.901     2. Wheezing  R06.2 DG Chest 2 View    DG Chest 2 View     Asthma with acute exacerbation, unspecified asthma severity, unspecified whether persistent Afebrile, nontoxic-appearing, NAD. VSS. DDX includes but not limited to: COVID, flu, asthma exacerbation, bronchitis, pneumonia COVID and flu were negative today in office.  Imaging was negative for infiltrate.  Suspect asthma exacerbation.  Patient appears to have history of asthma, but is not being treated and does not have maintenance medications.  DuoNeb was given  today in office.  Patient reports improvement in wheezing resolved.  Albuterol inhaler 2 puffs every 6 hours as needed was prescribed as well as albuterol solution 3 mL every 6 hours as needed for wheezing and shortness of breath.  Patient was also given methylprednisolone 40 mg IM today in office and he was prescribed prednisone 40 mg as directed for exacerbation.  Doxycycline 100 mg twice daily was prescribed for suspected bronchitis as sick inciting cause. Strict ED precautions were given and patient verbalized understanding.  Wheezing Afebrile, nontoxic-appearing, NAD. VSS. DDX includes but not limited to: COVID, flu, asthma exacerbation, bronchitis, pneumonia COVID and flu were negative today in office.  Imaging was negative for infiltrate.  Suspect asthma exacerbation.  Patient appears to have history of asthma, but is not being treated and does not have maintenance medications.  DuoNeb was given today in office.  Patient reports improvement in wheezing resolved.  Albuterol inhaler 2 puffs every 6 hours as needed was prescribed as well as albuterol solution 3 mL every 6 hours as needed for wheezing and shortness of breath.  Patient was also given methylprednisolone 40 mg IM today in office and he was prescribed prednisone 40 mg as directed for exacerbation.  Doxycycline 100 mg twice daily was prescribed for suspected bronchitis as sick inciting cause. Strict ED precautions were given and patient verbalized understanding.  ED Discharge Orders          Ordered    albuterol (VENTOLIN HFA) 108 (90 Base) MCG/ACT inhaler  Every 6 hours PRN        02/24/23 1155    doxycycline (VIBRAMYCIN) 100 MG capsule  2 times daily        02/24/23 1155    benzonatate (TESSALON) 200 MG capsule  3 times daily PRN        02/24/23 1155    albuterol (PROVENTIL) (2.5 MG/3ML) 0.083% nebulizer solution  Every 6 hours PRN        02/24/23 1155             PDMP not reviewed this encounter.     Kaspian Muccio P,  PA-C 02/24/23 1201    Aryelle Figg P, PA-C 02/24/23 1202

## 2023-02-24 NOTE — ED Triage Notes (Signed)
Pt c/o cough, HA, feeling dizzy and fever for 2 days. Taking theraflu.

## 2023-12-16 ENCOUNTER — Ambulatory Visit
Admission: EM | Admit: 2023-12-16 | Discharge: 2023-12-16 | Disposition: A | Discharge status: Home / Self Care | Attending: Emergency Medicine | Admitting: Emergency Medicine

## 2023-12-16 ENCOUNTER — Other Ambulatory Visit: Payer: Self-pay

## 2023-12-16 ENCOUNTER — Encounter: Payer: Self-pay | Admitting: Emergency Medicine

## 2023-12-16 DIAGNOSIS — B084 Enteroviral vesicular stomatitis with exanthem: Secondary | ICD-10-CM | POA: Diagnosis not present

## 2023-12-16 DIAGNOSIS — J45901 Unspecified asthma with (acute) exacerbation: Secondary | ICD-10-CM | POA: Diagnosis not present

## 2023-12-16 MED ORDER — ALBUTEROL SULFATE HFA 108 (90 BASE) MCG/ACT IN AERS
2.0000 | INHALATION_SPRAY | Freq: Once | RESPIRATORY_TRACT | Status: AC
  Administered 2023-12-16: 2 via RESPIRATORY_TRACT

## 2023-12-16 MED ORDER — PREDNISONE 20 MG PO TABS: 40.0000 mg | ORAL_TABLET | Freq: Every day | ORAL | 0 refills | Status: AC

## 2023-12-16 NOTE — Discharge Instructions (Addendum)
 Use the inhaler every 4-6 hours as needed for wheezing or shortness of breath. Take steroids daily with breakfast.  Follow-up with a primary care provider for further management of asthma.   Hand foot and mouth disease is a viral illness that will heal on its own.  You are no longer contagious once the blistered lesions have crusted over.  You can alternate between 600 mg of ibuprofen  and 500 mg of Tylenol  every 4-6 hours to help with fever, body aches or chills.  Return to clinic for new or urgent symptoms.  Seek immediate care for worsening shortness of breath or trouble breathing.

## 2023-12-16 NOTE — ED Triage Notes (Addendum)
 Pt reports was diagnosed with hand, foot, mouth last week and reports fever, cough developed last night. Pt reports hx of asthma. Ran out of inhaler a few months ago and reports needs pcp. Mild dyspnea with exertion. NAD noted.  Discussed how to make appointment with pcp in triage.

## 2023-12-16 NOTE — ED Provider Notes (Signed)
 [facility]    CSN: [id number] Arrival date & time: 12/16/23  0830      History   Chief Complaint Chief Complaint  Patient presents with   Cough    HPI Derrick Mayer is a 23 y.o. male.   Patient presents to clinic over concern of chest tightness, wheezing and shortness of breath that started yesterday around noon while he was at work.  Recently found out he had hand-foot-and-mouth, kids were sick with this illness last week.  Does have sores and lesions in his mouth, on his palms of his hands, and soles of his feet.  Noticed these yesterday.  Has had a fever.  History of asthma, does not have a PCP.  Ran out of his inhaler 2 or 3 months ago.  Endorses generalized bodyaches and fatigue.  Did take NyQuil yesterday after he got home from work and went to bed.  The history is provided by the patient and medical records.  Cough   Past Medical History:  Diagnosis Date   ADHD (attention deficit hyperactivity disorder)    Asthma    Attention deficit hyperactivity disorder (ADHD) 05/24/2015   Cancer (HCC)    Cannabis abuse 05/20/2015   Collagen vascular disease    Depressive disorder 05/20/2015   History of ADHD 05/20/2015   Substance induced mood disorder (HCC) 05/20/2015    Patient Active Problem List   Diagnosis Date Noted   Cellulitis of right index finger 02/21/2020   Asthma 02/21/2020   Perirectal abscess 04/13/2018   Attention deficit hyperactivity disorder (ADHD) 05/24/2015   Cannabis abuse 05/20/2015   Depressive disorder 05/20/2015   Substance induced mood disorder (HCC) 05/20/2015   ADHD (attention deficit hyperactivity disorder) 10/30/2012   Knee contusion 09/29/2012   Left knee sprain 09/13/2012   Abrasion of left leg 06/20/2012    Past Surgical History:  Procedure Laterality Date   INCISION AND DRAINAGE ABSCESS N/A 04/13/2018   Procedure: INCISION AND DRAINAGE ABSCESS;  Surgeon: Tye Millet, DO;  Location: [facility] ORS;  Service: General;  Laterality:  N/A;   WISDOM TOOTH EXTRACTION         Home Medications    Prior to Admission medications   Medication Sig Start Date End Date Taking? Authorizing Provider  predniSONE  (DELTASONE ) 20 MG tablet Take 2 tablets (40 mg total) by mouth daily with breakfast for 5 days. 12/16/23 12/21/23 Yes Lativia Velie  N, FNP  albuterol  (PROVENTIL ) (2.5 MG/3ML) 0.083% nebulizer solution Take 3 mLs (2.5 mg total) by nebulization every 6 (six) hours as needed for wheezing or shortness of breath. 02/24/23   Hermanns, Ashlee P, PA-C  albuterol  (VENTOLIN  HFA) 108 (90 Base) MCG/ACT inhaler Inhale 2 puffs into the lungs every 6 (six) hours as needed for wheezing or shortness of breath. 02/24/23   Hermanns, Ashlee P, PA-C  benzonatate  (TESSALON ) 200 MG capsule Take 1 capsule (200 mg total) by mouth 3 (three) times daily as needed for cough. 02/24/23   Hermanns, Ashlee P, PA-C  ondansetron  (ZOFRAN  ODT) 8 MG disintegrating tablet Take 1 tablet (8 mg total) by mouth every 8 (eight) hours as needed for nausea or vomiting. Patient not taking: Reported on 03/26/2021 10/02/20   Iola Lukes, FNP    Family History History reviewed. No pertinent family history.  Social History Social History   Tobacco Use   Smoking status: Never   Smokeless tobacco: Never  Vaping Use   Vaping status: Never Used  Substance Use Topics   Alcohol use: No  Drug use: Yes    Types: Marijuana     Allergies   Vancomycin    Review of Systems Review of Systems  Respiratory:  Positive for cough.    Per HPI  Physical Exam Triage Vital Signs ED Triage Vitals  Encounter Vitals Group     BP 12/16/23 0852 128/80     Girls Systolic BP Percentile --      Girls Diastolic BP Percentile --      Boys Systolic BP Percentile --      Boys Diastolic BP Percentile --      Pulse Rate 12/16/23 0852 81     Resp 12/16/23 0852 (!) 22     Temp 12/16/23 0852 99.1 F (37.3 C)     Temp Source 12/16/23 0852 Oral     SpO2 12/16/23 0852 95 %      Weight --      Height --      Head Circumference --      Peak Flow --      Pain Score 12/16/23 0855 7     Pain Loc --      Pain Education --      Exclude from Growth Chart --    No data found.  Updated Vital Signs BP 128/80 (BP Location: Right Arm)   Pulse 81   Temp 99.1 F (37.3 C) (Oral)   Resp (!) 22   SpO2 95%   Visual Acuity Right Eye Distance:   Left Eye Distance:   Bilateral Distance:    Right Eye Near:   Left Eye Near:    Bilateral Near:     Physical Exam Vitals and nursing note reviewed.  Constitutional:      Appearance: Normal appearance.  HENT:     Head: Normocephalic and atraumatic.     Right Ear: External ear normal.     Left Ear: External ear normal.     Nose: Nose normal.     Mouth/Throat:     Mouth: Mucous membranes are moist.  Eyes:     Conjunctiva/sclera: Conjunctivae normal.  Cardiovascular:     Rate and Rhythm: Normal rate.  Pulmonary:     Effort: Pulmonary effort is normal. No respiratory distress.     Breath sounds: Wheezing present.  Musculoskeletal:        General: Normal range of motion.  Skin:    General: Skin is warm and dry.     Findings: Rash present.     Comments: Maculopapular rash with some vesicles to the palms, erythematous circular lesions to the roof of mouth and cheeks.  Neurological:     General: No focal deficit present.     Mental Status: He is alert.  Psychiatric:        Mood and Affect: Mood normal.        Behavior: Behavior normal. Behavior is cooperative.      UC Treatments / Results  Labs (all labs ordered are listed, but only abnormal results are displayed) Labs Reviewed - No data to display  EKG   Radiology No results found.  Procedures Procedures (including critical care time)  Medications Ordered in UC Medications  albuterol  (VENTOLIN  HFA) 108 (90 Base) MCG/ACT inhaler 2 puff (2 puffs Inhalation Given 12/16/23 0937)    Initial Impression / Assessment and Plan / UC Course  I have  reviewed the triage vital signs and the nursing notes.  Pertinent labs & imaging results that were available during my care of the patient were reviewed  by me and considered in my medical decision making (see chart for details).  Vitals and triage reviewed, patient is hemodynamically stable.  Lungs with wheezing in lower lobes, heart with regular rate and rhythm.  Difficulty taking deep breath, suspect asthma exacerbation.  Inhaler given in clinic, sent home on oral steroids.  Able to speak in full sentences, oxygenation stable, without evidence of respiratory distress.  Maculopapular rash with vesicular lesions to palms, soles of feet and mouth consistent with hand-foot-and-mouth.  Symptomatic management discussed.  Plan of care, follow-up care return precautions given, no questions at this time.  Work note provided.     Final Clinical Impressions(s) / UC Diagnoses   Final diagnoses:  Asthma with acute exacerbation, unspecified asthma severity, unspecified whether persistent  Hand, foot and mouth disease (HFMD)     Discharge Instructions      Use the inhaler every 4-6 hours as needed for wheezing or shortness of breath. Take steroids daily with breakfast.  Follow-up with a primary care provider for further management of asthma.   Hand foot and mouth disease is a viral illness that will heal on its own.  You are no longer contagious once the blistered lesions have crusted over.  You can alternate between 600 mg of ibuprofen  and 500 mg of Tylenol  every 4-6 hours to help with fever, body aches or chills.  Return to clinic for new or urgent symptoms.  Seek immediate care for worsening shortness of breath or trouble breathing.      ED Prescriptions     Medication Sig Dispense Auth. Provider   predniSONE  (DELTASONE ) 20 MG tablet Take 2 tablets (40 mg total) by mouth daily with breakfast for 5 days. 10 tablet Dreama, Fahad Cisse  N, FNP      PDMP not reviewed this encounter.    Dreama, Abhiram Criado  N, FNP 12/16/23 [phone]
